# Patient Record
Sex: Male | Born: 1965
Health system: Southern US, Community
[De-identification: ages and names within clinical notes are randomized; demographics above are authoritative.]

## PROBLEM LIST (undated history)

## (undated) DIAGNOSIS — F909 Attention-deficit hyperactivity disorder, unspecified type: Secondary | ICD-10-CM

## (undated) HISTORY — PX: ORCHIOPEXY: SHX479

## (undated) HISTORY — DX: Attention-deficit hyperactivity disorder, unspecified type: F90.9

## (undated) HISTORY — PX: WISDOM TOOTH EXTRACTION: SHX21

---

## 2005-05-18 ENCOUNTER — Ambulatory Visit (HOSPITAL_COMMUNITY): Admission: RE | Admit: 2005-05-18 | Discharge: 2005-05-18 | Payer: Self-pay | Admitting: Infectious Diseases

## 2010-07-25 ENCOUNTER — Inpatient Hospital Stay (INDEPENDENT_AMBULATORY_CARE_PROVIDER_SITE_OTHER)
Admission: RE | Admit: 2010-07-25 | Discharge: 2010-07-25 | Disposition: A | Discharge status: Home / Self Care | Payer: Self-pay | Source: Ambulatory Visit | Attending: Emergency Medicine | Admitting: Emergency Medicine

## 2010-07-25 DIAGNOSIS — J019 Acute sinusitis, unspecified: Secondary | ICD-10-CM

## 2010-07-25 DIAGNOSIS — J069 Acute upper respiratory infection, unspecified: Secondary | ICD-10-CM

## 2011-11-08 ENCOUNTER — Emergency Department (HOSPITAL_COMMUNITY)
Admission: EM | Admit: 2011-11-08 | Discharge: 2011-11-08 | Disposition: A | Discharge status: Home / Self Care | Payer: 59 | Attending: Emergency Medicine | Admitting: Emergency Medicine

## 2011-11-08 ENCOUNTER — Emergency Department (HOSPITAL_COMMUNITY): Payer: 59

## 2011-11-08 DIAGNOSIS — N390 Urinary tract infection, site not specified: Secondary | ICD-10-CM | POA: Insufficient documentation

## 2011-11-08 DIAGNOSIS — K7689 Other specified diseases of liver: Secondary | ICD-10-CM | POA: Insufficient documentation

## 2011-11-08 DIAGNOSIS — R319 Hematuria, unspecified: Secondary | ICD-10-CM

## 2011-11-08 LAB — DIFFERENTIAL
Basophils Absolute: 0 10*3/uL (ref 0.0–0.1)
Eosinophils Absolute: 0 10*3/uL (ref 0.0–0.7)
Lymphocytes Relative: 7 % — ABNORMAL LOW (ref 12–46)
Lymphs Abs: 1.2 10*3/uL (ref 0.7–4.0)
Neutrophils Relative %: 86 % — ABNORMAL HIGH (ref 43–77)

## 2011-11-08 LAB — URINALYSIS, ROUTINE W REFLEX MICROSCOPIC
Glucose, UA: NEGATIVE mg/dL
pH: 6 (ref 5.0–8.0)

## 2011-11-08 LAB — CBC
MCH: 29.6 pg (ref 26.0–34.0)
Platelets: 165 10*3/uL (ref 150–400)
RBC: 4.93 MIL/uL (ref 4.22–5.81)
WBC: 16.6 10*3/uL — ABNORMAL HIGH (ref 4.0–10.5)

## 2011-11-08 LAB — POCT I-STAT, CHEM 8
Creatinine, Ser: 1.2 mg/dL (ref 0.50–1.35)
Hemoglobin: 15 g/dL (ref 13.0–17.0)
Potassium: 3.6 mEq/L (ref 3.5–5.1)
Sodium: 137 mEq/L (ref 135–145)

## 2011-11-08 LAB — URINE MICROSCOPIC-ADD ON

## 2011-11-08 MED ORDER — CIPROFLOXACIN HCL 500 MG PO TABS: 500.0000 mg | ORAL_TABLET | Freq: Two times a day (BID) | ORAL | Status: AC

## 2011-11-08 NOTE — ED Notes (Addendum)
Katha RN made aware pt is in room and has not been triaged.  Pt in no acute distress.  Sent to bathroom for urine sample

## 2011-11-08 NOTE — ED Notes (Signed)
The pt was escorted to the d/c desk. 

## 2011-11-08 NOTE — ED Provider Notes (Signed)
Medical screening examination/treatment/procedure(s) were performed by non-physician practitioner and as supervising physician I was immediately available for consultation/collaboration.   Forbes Cellar, MD 11/08/11 [phone]

## 2011-11-08 NOTE — ED Provider Notes (Signed)
History     CSN: [id number]  Arrival date & time 11/08/11  1001   First MD Initiated Contact with Patient 11/08/11 1017      10:46 AM HPI Patient reports last night began having dysuria, hematuria. Reports blood in urine slowly became clots. Reports at approximately 6 AM this resolved he continues to have increased urinary urgency, and dysuria. States he also noticed clear penile discharge. Reports mild abdominal pain, back pain, and flank pain. Patient is a 46 y.o. male presenting with hematuria. The history is provided by the patient.  Hematuria This is a new problem. The current episode started today. The problem has been gradually improving since onset. He describes the hematuria as gross hematuria. The pain is mild. Irritative symptoms include frequency and urgency. Associated symptoms include abdominal pain. Pertinent negatives include no chills, fever, flank pain, nausea or vomiting. He is sexually active. There is no history of GU trauma, kidney stones, prostatitis or STDs.    No past medical history on file.  No past surgical history on file.  No family history on file.  History  Substance Use Topics  . Smoking status: Not on file  . Smokeless tobacco: Not on file  . Alcohol Use: Not on file      Review of Systems  Constitutional: Negative for fever and chills.  Respiratory: Negative for shortness of breath.   Cardiovascular: Negative for chest pain.  Gastrointestinal: Positive for abdominal pain. Negative for nausea, vomiting, diarrhea, constipation, blood in stool and rectal pain.  Genitourinary: Positive for urgency, frequency and hematuria. Negative for flank pain, discharge, penile swelling, scrotal swelling, penile pain and testicular pain.  Musculoskeletal: Positive for back pain.  Neurological: Negative for dizziness, weakness, numbness and headaches.  All other systems reviewed and are negative.    Allergies  Review of patient's allergies indicates no  known allergies.  Home Medications   Current Outpatient Rx  Name Route Sig Dispense Refill  . LISDEXAMFETAMINE DIMESYLATE 40 MG PO CAPS Oral Take 40 mg by mouth daily.      BP 113/61  Pulse 95  Temp(Src) 99.1 F (37.3 C) (Oral)  Resp 18  SpO2 99%  Physical Exam  Constitutional: He is oriented to person, place, and time. He appears well-developed and well-nourished.  HENT:  Head: Normocephalic and atraumatic.  Eyes: Conjunctivae are normal. Pupils are equal, round, and reactive to light.  Neck: Normal range of motion. Neck supple.  Cardiovascular: Normal rate, regular rhythm and normal heart sounds.   Pulmonary/Chest: Effort normal and breath sounds normal.  Abdominal: Soft. Bowel sounds are normal. He exhibits no distension and no mass. There is no hepatosplenomegaly. There is no tenderness. There is no rigidity, no rebound, no guarding and no CVA tenderness.  Genitourinary: No penile tenderness. Discharge (clear) found.  Neurological: He is alert and oriented to person, place, and time.  Skin: Skin is warm and dry. No rash noted. No erythema. No pallor.  Psychiatric: He has a normal mood and affect. His behavior is normal.    ED Course  Procedures  Results for orders placed during the hospital encounter of 11/08/11  URINALYSIS, ROUTINE W REFLEX MICROSCOPIC      Component Value Range   Color, Urine AMBER (*) YELLOW    APPearance CLOUDY (*) CLEAR    Specific Gravity, Urine 1.025  1.005 - 1.030    pH 6.0  5.0 - 8.0    Glucose, UA NEGATIVE  NEGATIVE (mg/dL)   Hgb urine dipstick LARGE (*)  NEGATIVE    Bilirubin Urine NEGATIVE  NEGATIVE    Ketones, ur TRACE (*) NEGATIVE (mg/dL)   Protein, ur 540 (*) NEGATIVE (mg/dL)   Urobilinogen, UA 0.2  0.0 - 1.0 (mg/dL)   Nitrite NEGATIVE  NEGATIVE    Leukocytes, UA LARGE (*) NEGATIVE   CBC      Component Value Range   WBC 16.6 (*) 4.0 - 10.5 (K/uL)   RBC 4.93  4.22 - 5.81 (MIL/uL)   Hemoglobin 14.6  13.0 - 17.0 (g/dL)   HCT 98.1   19.1 - 47.8 (%)   MCV 84.0  78.0 - 100.0 (fL)   MCH 29.6  26.0 - 34.0 (pg)   MCHC 35.3  30.0 - 36.0 (g/dL)   RDW 29.5  62.1 - 30.8 (%)   Platelets 165  150 - 400 (K/uL)  DIFFERENTIAL      Component Value Range   Neutrophils Relative 86 (*) 43 - 77 (%)   Neutro Abs 14.3 (*) 1.7 - 7.7 (K/uL)   Lymphocytes Relative 7 (*) 12 - 46 (%)   Lymphs Abs 1.2  0.7 - 4.0 (K/uL)   Monocytes Relative 7  3 - 12 (%)   Monocytes Absolute 1.1 (*) 0.1 - 1.0 (K/uL)   Eosinophils Relative 0  0 - 5 (%)   Eosinophils Absolute 0.0  0.0 - 0.7 (K/uL)   Basophils Relative 0  0 - 1 (%)   Basophils Absolute 0.0  0.0 - 0.1 (K/uL)  POCT I-STAT, CHEM 8      Component Value Range   Sodium 137  135 - 145 (mEq/L)   Potassium 3.6  3.5 - 5.1 (mEq/L)   Chloride 103  96 - 112 (mEq/L)   BUN 12  6 - 23 (mg/dL)   Creatinine, Ser 6.57  0.50 - 1.35 (mg/dL)   Glucose, Bld 846 (*) 70 - 99 (mg/dL)   Calcium, Ion 9.62  1.12 - 1.32 (mmol/L)   TCO2 20  0 - 100 (mmol/L)   Hemoglobin 15.0  13.0 - 17.0 (g/dL)   HCT 95.2  84.1 - 32.4 (%)  URINE MICROSCOPIC-ADD ON      Component Value Range   WBC, UA 21-50  <3 (WBC/hpf)   RBC / HPF 11-20  <3 (RBC/hpf)   Bacteria, UA MANY (*) RARE    Urine-Other MUCOUS PRESENT     Ct Abdomen Pelvis Wo Contrast  11/08/2011  *RADIOLOGY REPORT*  Clinical Data:  Testicular and back pain.  Rule out kidney stones.  CT ABDOMEN AND PELVIS WITHOUT CONTRAST (CT UROGRAM)  Technique: Contiguous axial images of the abdomen and pelvis without oral or intravenous contrast were obtained.  Comparison: None  Findings:  Exam is limited for evaluation of entities other than urinary tract calculi due to lack of oral or intravenous contrast.   Minimal atelectasis at the lung bases. Normal heart size without pericardial or pleural effusion.  Mild hepatic steatosis.  Normal uninfused appearance of the spleen, stomach, pancreas, gallbladder, biliary tract, adrenal glands. No renal calculi or hydronephrosis. A left extrarenal  pelvis.  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 No hydroureter or ureteric calculi.  Accessory lower pole left renal artery. No retroperitoneal or retrocrural adenopathy.  Normal colon, appendix, and terminal ileum.  Normal small bowel without abdominal ascites.    No pelvic adenopathy.    Normal urinary bladder and prostate.  No significant free fluid.  Lucent lesion within the posterior left ilium on image 59 has a nonaggressive  appearance and may represent a lipoma or hemangioma.  Degenerative disc disease at the lumbosacral junction.  IMPRESSION:  1.  No urinary tract calculi or hydronephrosis. 2.  Atypically positioned right kidney, as detailed above. 3.  Mild hepatic steatosis.  Original Report Authenticated By: Consuello Bossier, M.D.     MDM    12:24 PM Patient has not wanted anything for pain. Notes indicate the patient has a urinary tract infection versus hemorrhagic cystitis. Advise close followup with his primary care physician for urine recheck in one week. Patient states he follows up with [facility]. Advised patient's GC and Chlamydia tests are still pending if they're positive he will receive a call be placed on the appropriate antibiotics. Patient voices understanding and is ready for discharge. Again does not request anything for pain to go home with. We'll prescribe Cipro x7 days.     Thomasene Lot, PA-C 11/08/11 1226

## 2011-11-08 NOTE — ED Notes (Signed)
Pt reports acute onset of urethral  pain, with frequency, hesitancy, and hematuria that finally cleared up around 4am followed by penial d/c.Marland Kitchen    With hematuria, and white d/c

## 2011-11-08 NOTE — ED Notes (Signed)
Patient transported to CT, has now returned...states pain is still 2-3/10 warm blanket given.

## 2011-11-08 NOTE — Discharge Instructions (Signed)

## 2011-11-09 LAB — GC/CHLAMYDIA PROBE AMP, GENITAL: GC Probe Amp, Genital: NEGATIVE

## 2014-12-16 ENCOUNTER — Ambulatory Visit (INDEPENDENT_AMBULATORY_CARE_PROVIDER_SITE_OTHER): Payer: 59

## 2014-12-16 ENCOUNTER — Ambulatory Visit (INDEPENDENT_AMBULATORY_CARE_PROVIDER_SITE_OTHER): Payer: 59 | Admitting: Family Medicine

## 2014-12-16 VITALS — BP 118/80 | HR 92 | Temp 98.4°F | Resp 16 | Ht 71.0 in | Wt 193.4 lb

## 2014-12-16 DIAGNOSIS — M25511 Pain in right shoulder: Secondary | ICD-10-CM

## 2014-12-16 MED ORDER — DICLOFENAC SODIUM 75 MG PO TBEC: 75.0000 mg | DELAYED_RELEASE_TABLET | Freq: Two times a day (BID) | ORAL | Status: DC

## 2014-12-16 NOTE — Patient Instructions (Signed)
Take diclofenac one twice daily  Apply ice 2-4 times daily  Gentle stretches  Bursitis Bursitis is a swelling and soreness (inflammation) of a fluid-filled sac (bursa) that overlies and protects a joint. It can be caused by injury, overuse of the joint, arthritis or infection. The joints most likely to be affected are the elbows, shoulders, hips and knees. HOME CARE INSTRUCTIONS   Apply ice to the affected area for 15-20 minutes each hour while awake for 2 days. Put the ice in a plastic bag and place a towel between the bag of ice and your skin.  Rest the injured joint as much as possible, but continue to put the joint through a full range of motion, 4 times per day. (The shoulder joint especially becomes rapidly "frozen" if not used.) When the pain lessens, begin normal slow movements and usual activities.  Only take over-the-counter or prescription medicines for pain, discomfort or fever as directed by your caregiver.  Your caregiver may recommend draining the bursa and injecting medicine into the bursa. This may help the healing process.  Follow all instructions for follow-up with your caregiver. This includes any orthopedic referrals, physical therapy and rehabilitation. Any delay in obtaining necessary care could result in a delay or failure of the bursitis to heal and chronic pain. SEEK IMMEDIATE MEDICAL CARE IF:   Your pain increases even during treatment.  You develop an oral temperature above 102 F (38.9 C) and have heat and inflammation over the involved bursa. MAKE SURE YOU:   Understand these instructions.  Will watch your condition.  Will get help right away if you are not doing well or get worse. Document Released: 05/18/2000 Document Revised: 08/13/2011 Document Reviewed: 08/10/2013 [facility] Patient Information 2015 [place], [place]. This information is not intended to replace advice given to you by your health care provider. Make sure you discuss any questions you  have with your health care provider.
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
## 2015-06-17 DIAGNOSIS — J342 Deviated nasal septum: Secondary | ICD-10-CM | POA: Diagnosis not present

## 2015-06-17 DIAGNOSIS — J343 Hypertrophy of nasal turbinates: Secondary | ICD-10-CM | POA: Diagnosis not present

## 2015-07-12 ENCOUNTER — Encounter (HOSPITAL_BASED_OUTPATIENT_CLINIC_OR_DEPARTMENT_OTHER): Payer: Self-pay | Admitting: *Deleted

## 2015-07-12 MED FILL — METHYLPHENIDATE ER 36 MG TA: 36 | 30 days supply | Qty: 30 | Fill #0

## 2015-07-13 ENCOUNTER — Ambulatory Visit: Payer: Self-pay | Admitting: Otolaryngology

## 2015-07-13 MED FILL — CEFUROXIME AXETIL 250 MG TA: 250 | 10 days supply | Qty: 20 | Fill #0

## 2015-07-13 NOTE — H&P (Signed)
NAMESIDDH, CRENSHAW NO.:  [id number]  MEDICAL RECORD NO.:  [id number]  LOCATION:                                 FACILITY:  PHYSICIAN:  Minna Merritts, M.D.   DATE OF BIRTH:  Aug 26, 1965  DATE OF ADMISSION: DATE OF DISCHARGE:                             HISTORY & PHYSICAL   HISTORY OF PRESENT ILLNESS:  The patient is a 50 year old male who comes in with multiple sinus infections and he has a severe deviated nasal septum.  Been seen in my office and has a history of nasal trauma playing basketball and football and has difficulty breathing through his nose, history of sinusitis.  He wishes a septal reconstruction, and on examination we could easily see the severe septal deviation.  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  He has also had ADHD history.  ALLERGIES:  He is allergic to no medications.  MEDICATIONS:  Concerta 54 mg per day is his only medication.  PAST SURGICAL HISTORY:  He apparently denied any past surgeries.  FAMILY HISTORY:  Colon cancer in grandfather.  Mom had ALS, blood pressure elevation, and diabetes.  SOCIAL HISTORY:  He hikes, exercises, drinks a couple of beers a week, does not smoke.  REVIEW OF SYSTEMS:  Also unremarkable in the fact that he has had the nasal blockage and the rhinitis and pressure in his ears, but he has no cardiovascular, no wheezing or shortness of breath.  He has had a cough in the past associated with sinus infection.  Also, history of reflux. Genitourinary, musculoskeletal, neurologic, endocrine, hematologic, allergy all negative.  He has ADHD and is otherwise in excellent health.  PHYSICAL EXAMINATION:  HEENT:  Ears to be completely clear.  The tympanic membranes looked excellent and moved well.  His nose however does show a severe septal deviation going to the left, blocks his nose, and it is also quite narrow but contributing  factor to his sinusitis. He has about a 2 year history of sinusitis.  His nasal blockage I would say would be roughly 80% blockage on the left and more airway on the right.  So his airway would be 20% on the left and 80% on the right, it is just the nose is blocked up.  He tries to eat.  Sometimes feel blocked up as well when he tries to eat and definitely when he tries to exercise.  Turbinate hypertrophy as well which he has compensatory hypertrophy as well.  His larynx is clear.  True cords, false cords, epiglottis, base of tongue, lateral pharyngeal walls are clear of ulceration, mass, or lesion, and true cord mobility, gag reflex, tongue mobility, EOM, facial nerve are all symmetrical as is his shoulder strength.  His EOMs are completely clear.  He has no evidence of any depression, and he is oriented x3.  The oral cavity is completely clear. Lips, teeth, and gums are unremarkable. Buccal mucous membranes are also unremarkable.  He shows no reflux and no post cricoid erythema on his laryngeal examination. NECK:  Free of any thyromegaly, cervical adenopathy or mass.  No salivary gland  abnormalities. GENERAL:  Excellent.  Posterior triangles and scalp and skin are clear. RESPIRATORY ISSUES:  None. CARDIOVASCULAR:  None. ABDOMINAL ISSUES:  None except the reflux. EXTREMITIES:  Also unremarkable.  INITIAL DIAGNOSIS:  Septal deviation with turbinate hypertrophy, with history of sinusitis, with severe nasal obstruction issues.          ______________________________ Minna Merritts, M.D.     JC/MEDQ  D:  07/13/2015  T:  07/13/2015  Job:  [id number]

## 2015-07-14 ENCOUNTER — Encounter (HOSPITAL_BASED_OUTPATIENT_CLINIC_OR_DEPARTMENT_OTHER)
Admission: RE | Admit: 2015-07-14 | Discharge: 2015-07-14 | Disposition: A | Discharge status: Home / Self Care | Payer: 59 | Source: Ambulatory Visit | Attending: Otolaryngology | Admitting: Otolaryngology

## 2015-07-14 DIAGNOSIS — J343 Hypertrophy of nasal turbinates: Secondary | ICD-10-CM | POA: Diagnosis present

## 2015-07-14 LAB — CBC
HCT: 42.7 % (ref 39.0–52.0)
Hemoglobin: 14.8 g/dL (ref 13.0–17.0)
MCH: 29.7 pg (ref 26.0–34.0)
MCHC: 34.7 g/dL (ref 30.0–36.0)
MCV: 85.7 fL (ref 78.0–100.0)
Platelets: 152 K/uL (ref 150–400)
RBC: 4.98 MIL/uL (ref 4.22–5.81)
RDW: 12.2 % (ref 11.5–15.5)
WBC: 8.7 K/uL (ref 4.0–10.5)

## 2015-07-15 ENCOUNTER — Encounter (HOSPITAL_BASED_OUTPATIENT_CLINIC_OR_DEPARTMENT_OTHER)
Admission: RE | Disposition: A | Discharge status: Home / Self Care | Payer: Self-pay | Source: Ambulatory Visit | Attending: Otolaryngology

## 2015-07-15 ENCOUNTER — Ambulatory Visit (HOSPITAL_BASED_OUTPATIENT_CLINIC_OR_DEPARTMENT_OTHER): Payer: 59 | Admitting: Anesthesiology

## 2015-07-15 ENCOUNTER — Ambulatory Visit (HOSPITAL_BASED_OUTPATIENT_CLINIC_OR_DEPARTMENT_OTHER)
Admission: RE | Admit: 2015-07-15 | Discharge: 2015-07-15 | Disposition: A | Discharge status: Home / Self Care | Payer: 59 | Source: Ambulatory Visit | Attending: Otolaryngology | Admitting: Otolaryngology

## 2015-07-15 ENCOUNTER — Encounter (HOSPITAL_BASED_OUTPATIENT_CLINIC_OR_DEPARTMENT_OTHER): Payer: Self-pay | Admitting: *Deleted

## 2015-07-15 DIAGNOSIS — J342 Deviated nasal septum: Secondary | ICD-10-CM | POA: Diagnosis not present

## 2015-07-15 DIAGNOSIS — J343 Hypertrophy of nasal turbinates: Secondary | ICD-10-CM | POA: Insufficient documentation

## 2015-07-15 HISTORY — PX: TURBINATE REDUCTION: SHX6157

## 2015-07-15 HISTORY — PX: NASAL RECONSTRUCTION WITH SEPTAL REPAIR: SHX5665

## 2015-07-15 SURGERY — RECONSTRUCTION, NOSE, WITH NASAL SEPTUM REPAIR
Anesthesia: General | Site: Nose

## 2015-07-15 MED ORDER — HYDROMORPHONE HCL 1 MG/ML IJ SOLN: 0.2500 mg | INTRAMUSCULAR | Status: DC | PRN

## 2015-07-15 MED ORDER — BACITRACIN-NEOMYCIN-POLYMYXIN 400-5-5000 EX OINT
TOPICAL_OINTMENT | CUTANEOUS | Status: AC
  Filled 2015-07-15: qty 1

## 2015-07-15 MED ORDER — GLYCOPYRROLATE 0.2 MG/ML IJ SOLN: 0.2000 mg | Freq: Once | INTRAMUSCULAR | Status: DC | PRN

## 2015-07-15 MED ORDER — LIDOCAINE HCL (CARDIAC) 20 MG/ML IV SOLN
INTRAVENOUS | Status: AC
  Filled 2015-07-15: qty 5

## 2015-07-15 MED ORDER — ONDANSETRON HCL 4 MG/2ML IJ SOLN
INTRAMUSCULAR | Status: DC | PRN
  Administered 2015-07-15: 4 mg via INTRAVENOUS

## 2015-07-15 MED ORDER — LIDOCAINE-EPINEPHRINE 1 %-1:100000 IJ SOLN
INTRAMUSCULAR | Status: DC | PRN
  Administered 2015-07-15: 3 mL

## 2015-07-15 MED ORDER — HYDROCODONE-IBUPROFEN 5-200 MG PO TABS: 1.0000 | ORAL_TABLET | Freq: Three times a day (TID) | ORAL | Status: DC | PRN

## 2015-07-15 MED ORDER — EPHEDRINE SULFATE 50 MG/ML IJ SOLN
INTRAMUSCULAR | Status: AC
  Filled 2015-07-15: qty 1

## 2015-07-15 MED ORDER — LACTATED RINGERS IV SOLN
INTRAVENOUS | Status: DC
  Administered 2015-07-15 (×2): via INTRAVENOUS

## 2015-07-15 MED ORDER — PROMETHAZINE HCL 25 MG/ML IJ SOLN: 6.2500 mg | INTRAMUSCULAR | Status: DC | PRN

## 2015-07-15 MED ORDER — LIDOCAINE HCL (CARDIAC) 20 MG/ML IV SOLN
INTRAVENOUS | Status: DC | PRN
  Administered 2015-07-15 (×2): 50 mg via INTRAVENOUS

## 2015-07-15 MED ORDER — OXYCODONE HCL 5 MG/5ML PO SOLN: 5.0000 mg | Freq: Once | ORAL | Status: DC | PRN

## 2015-07-15 MED ORDER — DEXAMETHASONE SODIUM PHOSPHATE 4 MG/ML IJ SOLN
INTRAMUSCULAR | Status: DC | PRN
  Administered 2015-07-15: 10 mg via INTRAVENOUS

## 2015-07-15 MED ORDER — PROPOFOL 10 MG/ML IV BOLUS
INTRAVENOUS | Status: DC | PRN
  Administered 2015-07-15: 200 mg via INTRAVENOUS

## 2015-07-15 MED ORDER — MEPERIDINE HCL 25 MG/ML IJ SOLN: 6.2500 mg | INTRAMUSCULAR | Status: DC | PRN

## 2015-07-15 MED ORDER — DEXAMETHASONE SODIUM PHOSPHATE 10 MG/ML IJ SOLN
INTRAMUSCULAR | Status: AC
  Filled 2015-07-15: qty 1

## 2015-07-15 MED ORDER — ATROPINE SULFATE 0.4 MG/ML IJ SOLN
INTRAMUSCULAR | Status: AC
  Filled 2015-07-15: qty 1

## 2015-07-15 MED ORDER — SODIUM CHLORIDE 0.9 % IJ SOLN
INTRAMUSCULAR | Status: AC
  Filled 2015-07-15: qty 10

## 2015-07-15 MED ORDER — SUCCINYLCHOLINE CHLORIDE 20 MG/ML IJ SOLN
INTRAMUSCULAR | Status: AC
  Filled 2015-07-15: qty 1

## 2015-07-15 MED ORDER — SUFENTANIL CITRATE 50 MCG/ML IV SOLN
INTRAVENOUS | Status: AC
  Filled 2015-07-15: qty 1

## 2015-07-15 MED ORDER — FENTANYL CITRATE (PF) 100 MCG/2ML IJ SOLN: 50.0000 ug | INTRAMUSCULAR | Status: DC | PRN

## 2015-07-15 MED ORDER — BACITRACIN-NEOMYCIN-POLYMYXIN 400-5-5000 EX OINT
TOPICAL_OINTMENT | CUTANEOUS | Status: DC | PRN
  Administered 2015-07-15: 1 via TOPICAL

## 2015-07-15 MED ORDER — SODIUM BICARBONATE 4 % IV SOLN
INTRAVENOUS | Status: AC
  Filled 2015-07-15: qty 5

## 2015-07-15 MED ORDER — PHENYLEPHRINE 40 MCG/ML (10ML) SYRINGE FOR IV PUSH (FOR BLOOD PRESSURE SUPPORT)
PREFILLED_SYRINGE | INTRAVENOUS | Status: AC
  Filled 2015-07-15: qty 10

## 2015-07-15 MED ORDER — PROPOFOL 500 MG/50ML IV EMUL
INTRAVENOUS | Status: AC
Start: 2015-07-15 — End: 2015-07-15
  Filled 2015-07-15: qty 50

## 2015-07-15 MED ORDER — SUCCINYLCHOLINE CHLORIDE 20 MG/ML IJ SOLN
INTRAMUSCULAR | Status: DC | PRN
  Administered 2015-07-15: 180 mg via INTRAVENOUS

## 2015-07-15 MED ORDER — ONDANSETRON HCL 4 MG/2ML IJ SOLN
INTRAMUSCULAR | Status: AC
  Filled 2015-07-15: qty 2

## 2015-07-15 MED ORDER — SCOPOLAMINE 1 MG/3DAYS TD PT72: 1.0000 | MEDICATED_PATCH | Freq: Once | TRANSDERMAL | Status: DC | PRN

## 2015-07-15 MED ORDER — MIDAZOLAM HCL 2 MG/2ML IJ SOLN
INTRAMUSCULAR | Status: AC
  Filled 2015-07-15: qty 2

## 2015-07-15 MED ORDER — COCAINE HCL 4 % EX SOLN
CUTANEOUS | Status: AC
  Filled 2015-07-15: qty 4

## 2015-07-15 MED ORDER — OXYCODONE HCL 5 MG PO TABS: 5.0000 mg | ORAL_TABLET | Freq: Once | ORAL | Status: DC | PRN

## 2015-07-15 MED ORDER — MIDAZOLAM HCL 2 MG/2ML IJ SOLN
1.0000 mg | INTRAMUSCULAR | Status: DC | PRN
  Administered 2015-07-15: 2 mg via INTRAVENOUS

## 2015-07-15 MED ORDER — SUFENTANIL CITRATE 50 MCG/ML IV SOLN
INTRAVENOUS | Status: DC | PRN
  Administered 2015-07-15 (×2): 10 ug via INTRAVENOUS

## 2015-07-15 MED FILL — HYDROCOD-IBU 7.5-200 TAB: 7.5-200 | 10 days supply | Qty: 30 | Fill #0

## 2015-07-15 SURGICAL SUPPLY — 30 items
ATTRACTOMAT 16X20 MAGNETIC DRP (DRAPES) ×2 IMPLANT
CANISTER SUCT 1200ML W/VALVE (MISCELLANEOUS) ×4 IMPLANT
COAGULATOR SUCT 8FR VV (MISCELLANEOUS) ×2 IMPLANT
DRSG NASAL KENNEDY LMNT 8CM (GAUZE/BANDAGES/DRESSINGS) IMPLANT
GLOVE BIOGEL PI IND STRL 7.0 (GLOVE) IMPLANT
GLOVE BIOGEL PI INDICATOR 7.0 (GLOVE) ×2
GLOVE ECLIPSE 6.5 STRL STRAW (GLOVE) ×2 IMPLANT
GLOVE SURG SS PI 7.5 STRL IVOR (GLOVE) ×4 IMPLANT
GOWN STRL REUS W/ TWL LRG LVL3 (GOWN DISPOSABLE) ×2 IMPLANT
GOWN STRL REUS W/ TWL XL LVL3 (GOWN DISPOSABLE) ×2 IMPLANT
GOWN STRL REUS W/TWL LRG LVL3 (GOWN DISPOSABLE) ×4
GOWN STRL REUS W/TWL XL LVL3 (GOWN DISPOSABLE) ×4
NDL SPNL 25GX3.5 QUINCKE BL (NEEDLE) ×2 IMPLANT
NEEDLE SPNL 25GX3.5 QUINCKE BL (NEEDLE) ×4 IMPLANT
NS IRRIG 1000ML POUR BTL (IV SOLUTION) ×2 IMPLANT
PACK BASIN DAY SURGERY FS (CUSTOM PROCEDURE TRAY) ×4 IMPLANT
PACK ENT DAY SURGERY (CUSTOM PROCEDURE TRAY) ×4 IMPLANT
SPLINT NASAL THERMO PLAST (MISCELLANEOUS) IMPLANT
SPONGE GAUZE 2X2 8PLY STER LF (GAUZE/BANDAGES/DRESSINGS) ×1
SPONGE GAUZE 2X2 8PLY STRL LF (GAUZE/BANDAGES/DRESSINGS) ×3 IMPLANT
SPONGE SURGIFOAM ABS GEL 12-7 (HEMOSTASIS) IMPLANT
STRIP SUTURE WOUND CLOSURE 1/2 (SUTURE) IMPLANT
SUT BONE WAX W31G (SUTURE) ×2 IMPLANT
SUT PDS AB 4-0 P3 18 (SUTURE) IMPLANT
SUT PLAIN 4 0 ~~LOC~~ 1 (SUTURE) ×4 IMPLANT
SUT PLAIN 5 0 P 3 18 (SUTURE) ×4 IMPLANT
SYR 3ML 23GX1 SAFETY (SYRINGE) ×2 IMPLANT
TOWEL OR 17X24 6PK STRL BLUE (TOWEL DISPOSABLE) ×8 IMPLANT
TRAY DSU PREP LF (CUSTOM PROCEDURE TRAY) ×4 IMPLANT
YANKAUER SUCT BULB TIP NO VENT (SUCTIONS) ×4 IMPLANT

## 2015-07-15 NOTE — Brief Op Note (Signed)
07/15/2015  1:52 PM  PATIENT:  Nicholas Osborne  50 y.o. male  PRE-OPERATIVE DIAGNOSIS:  SEPTAL DEVIATION, TURBINATE HYPERTROPHY  POST-OPERATIVE DIAGNOSIS:  SEPTAL DEVIATION, TURBINATE HYPERTROPHY  PROCEDURE:  Procedure(s): SEPTAL REPAIR RECONSTRUCTION  (N/A) BILATERAL TURBINATE REDUCTION (Bilateral)  SURGEON:  Surgeon(s) and Role:    * Thornell Sartorius, MD - Primary  PHYSICIAN ASSISTANT:   ASSISTANTS: none   ANESTHESIA:   general  EBL:  Total I/O In: 1200 [I.V.:1200] Out: 200 [Blood:200]  BLOOD ADMINISTERED:none  DRAINS: none   LOCAL MEDICATIONS USED:  XYLOCAINE   SPECIMEN:  Source of Specimen:               septal cartilage  DISPOSITION OF SPECIMEN:  PATHOLOGY  COUNTS:  YES  TOURNIQUET:  * No tourniquets in log *  DICTATION: .Other Dictation: Dictation Number [id number]  PLAN OF CARE: Discharge to home after PACU  PATIENT DISPOSITION:  PACU - hemodynamically stable.   Delay start of Pharmacological VTE agent (>24hrs) due to surgical blood loss or risk of bleeding: not applicable

## 2015-07-15 NOTE — Interval H&P Note (Signed)
History and Physical Interval Note:  07/15/2015 12:10 PM  Nicholas Osborne  has presented today for surgery, with the diagnosis of SEPTAL DEVIATION, TURBINATE HYPERTROPHY  The various methods of treatment have been discussed with the patient and family. After consideration of risks, benefits and other options for treatment, the patient has consented to  Procedure(s): SEPTAL REPAIR RECONSTRUCTION  (N/A) BILATERAL TURBINATE REDUCTION (Bilateral) as a surgical intervention .  The patient's history has been reviewed, patient examined, no change in status, stable for surgery.  I have reviewed the patient's chart and labs.  Questions were answered to the patient's satisfaction.     Minna Merritts

## 2015-07-15 NOTE — Discharge Instructions (Addendum)
Remove packing in 1-2 days. Cool/iced compresses to nose for 4 hours today. Elevate head 30 degrees. Soft limited diet tonight. Call for bleeding concerns- if exceeds 3-4 drip pads per hour without activity. No nose blowing or strenuous activity.   Post Anesthesia Home Care Instructions  Activity: Get plenty of rest for the remainder of the day. A responsible adult should stay with you for 24 hours following the procedure.  For the next 24 hours, DO NOT: -Drive a car -Paediatric nurse -Drink alcoholic beverages -Take any medication unless instructed by your physician -Make any legal decisions or sign important papers.  Meals: Start with liquid foods such as gelatin or soup. Progress to regular foods as tolerated. Avoid greasy, spicy, heavy foods. If nausea and/or vomiting occur, drink only clear liquids until the nausea and/or vomiting subsides. Call your physician if vomiting continues.  Special Instructions/Symptoms: Your throat may feel dry or sore from the anesthesia or the breathing tube placed in your throat during surgery. If this causes discomfort, gargle with warm salt water. The discomfort should disappear within 24 hours.  If you had a scopolamine patch placed behind your ear for the management of post- operative nausea and/or vomiting:  1. The medication in the patch is effective for 72 hours, after which it should be removed.  Wrap patch in a tissue and discard in the trash. Wash hands thoroughly with soap and water. 2. You may remove the patch earlier than 72 hours if you experience unpleasant side effects which may include dry mouth, dizziness or visual disturbances. 3. Avoid touching the patch. Wash your hands with soap and water after contact with the patch.

## 2015-07-15 NOTE — Anesthesia Preprocedure Evaluation (Addendum)

## 2015-07-15 NOTE — Transfer of Care (Signed)
Immediate Anesthesia Transfer of Care Note  Patient: Nicholas Osborne  Procedure(s) Performed: Procedure(s): SEPTAL REPAIR RECONSTRUCTION  (N/A) BILATERAL TURBINATE REDUCTION (Bilateral)  Patient Location: PACU  Anesthesia Type:General  Level of Consciousness: awake, alert  and oriented  Airway & Oxygen Therapy: Patient Spontanous Breathing and Patient connected to face mask oxygen  Post-op Assessment: Report given to RN and Post -op Vital signs reviewed and stable  Post vital signs: Reviewed and stable  Last Vitals:  Filed Vitals:   07/15/15 1126  BP: 111/66  Pulse: 81  Temp: 36.8 C  Resp: 16    Complications: No apparent anesthesia complications

## 2015-07-15 NOTE — Anesthesia Postprocedure Evaluation (Signed)
Anesthesia Post Note  Patient: Nicholas Osborne  Procedure(s) Performed: Procedure(s) (LRB): SEPTAL REPAIR RECONSTRUCTION  (N/A) BILATERAL TURBINATE REDUCTION (Bilateral)  Patient location during evaluation: PACU Anesthesia Type: General Level of consciousness: awake and alert Pain management: pain level controlled Vital Signs Assessment: post-procedure vital signs reviewed and stable Respiratory status: spontaneous breathing, nonlabored ventilation and respiratory function stable Cardiovascular status: blood pressure returned to baseline and stable Postop Assessment: no signs of nausea or vomiting Anesthetic complications: no    Last Vitals:  Filed Vitals:   07/15/15 1415 07/15/15 1445  BP: 128/87 126/84  Pulse: 87 88  Temp:  36.9 C  Resp: 10 14    Last Pain:  Filed Vitals:   07/15/15 1448  PainSc: 0-No pain                 Marnie Fazzino A

## 2015-07-15 NOTE — Anesthesia Procedure Notes (Signed)
Procedure Name: Intubation Date/Time: 07/15/2015 12:25 PM Performed by: Melynda Ripple D Pre-anesthesia Checklist: Patient identified, Emergency Drugs available, Suction available and Patient being monitored Patient Re-evaluated:Patient Re-evaluated prior to inductionOxygen Delivery Method: Circle System Utilized Preoxygenation: Pre-oxygenation with 100% oxygen Intubation Type: IV induction Ventilation: Mask ventilation without difficulty Laryngoscope Size: Mac and 3 Grade View: Grade I Tube type: Oral Number of attempts: 1 Airway Equipment and Method: Stylet and Oral airway Placement Confirmation: ETT inserted through vocal cords under direct vision,  positive ETCO2 and breath sounds checked- equal and bilateral Secured at: 23 cm Tube secured with: Tape Dental Injury: Teeth and Oropharynx as per pre-operative assessment

## 2015-07-18 ENCOUNTER — Encounter (HOSPITAL_BASED_OUTPATIENT_CLINIC_OR_DEPARTMENT_OTHER): Payer: Self-pay | Admitting: Otolaryngology

## 2015-07-18 NOTE — Addendum Note (Signed)
Addendum  created 07/18/15 1104 by Marrianne Mood, CRNA   Modules edited: Charges VN

## 2015-07-18 NOTE — Op Note (Signed)
NAMEMARCELLO, [name] NO.:  [id number]  MEDICAL RECORD NO.:  [id number]  LOCATION: [facility]     FACILITY: [facility]  PHYSICIAN:  Minna Merritts, M.D.   DATE OF BIRTH:  1965/09/24  DATE OF PROCEDURE: 07/15/2015 DATE OF DISCHARGE: 07/15/2015                              OPERATIVE REPORT   PREOPERATIVE DIAGNOSIS:  Septal deviation turbinate hypertrophy.  POSTOPERATIVE DIAGNOSIS:  Septal deviation turbinate hypertrophy.  OPERATION:  Septal reconstruction, turbinate reduction.  ANESTHESIA:  General endotracheal.  SURGEON:  Minna Merritts, MD  The patient is aware of the risks and gains.  He is aware that he can have some bleeding postoperatively, he will have packing in his nose for probably 2 days and used to be very careful and quiet, not doing anything heavy work or any exercise.  He will also be on antibiotics and I will give him pain medication as well.  PROCEDURE IN DETAIL:  Patient placed in supine position under general endotracheal anesthesia.  The patient was prepped and draped in a proper manner using Hibiclens and usual head drape and 1% Xylocaine with epinephrine was used to inject into the turbinates, then into the septum, and then a hemitransfixion incision was made on the anterior left septal mucous membrane.  Membrane was then elevated using the sharp scissors and the [facility], and a portion of quadrilateral cartilage posteriorly was removed after the [place] had elevated the membrane.  Using the open and close Jansen-Middletons, there was also a severe septal deviation that was pushed way up against his left side of his nose and this was removed using the open and closed Ranelle Oyster- Middletons as well.  The septum was grossly off the midline and off the premaxillary crest, so the 4 mm chisel was used to take down this premaxillary crest.  A bone wax was used to limit any hemorrhage from that crest  bony structure.  Once this was corrected, the septum was brought back to the midline.  The middle turbinate and inferior turbinates were outfractured using the butter knife and the cottle elevator 12 was used to cauterize the anterior turbinates.  The suction Bovie electrocoagulation was used to minimize any bleeding as we bovied again the floor of the pre-septal area that did have some hemorrhagic issues.  The septum was so grossly deviated.  There was a lot of scar tissue and bony structure there that needed to be corrected.  Once this was achieved, the bleeding was discontinued.  The blood loss was approximately 150-200 mL and the closure was with 5-0 plain catgut and through and through septal suture blanket stitch with 4-0 plain catgut.  Intranasal dressing was placed.  Patient tolerated procedure well, was taken to the recovery room.   ______________________________ Minna Merritts, M.D.     JC/MEDQ  D:  07/15/2015  T:  07/16/2015  Job:  [id number]

## 2015-08-02 DIAGNOSIS — Z0000 Encounter for general adult medical examination without abnormal findings: Secondary | ICD-10-CM | POA: Diagnosis not present

## 2015-08-02 DIAGNOSIS — Z298 Encounter for other specified prophylactic measures: Secondary | ICD-10-CM | POA: Diagnosis not present

## 2015-08-02 DIAGNOSIS — F988 Other specified behavioral and emotional disorders with onset usually occurring in childhood and adolescence: Secondary | ICD-10-CM | POA: Diagnosis not present

## 2015-08-02 DIAGNOSIS — Z6827 Body mass index (BMI) 27.0-27.9, adult: Secondary | ICD-10-CM | POA: Diagnosis not present

## 2015-08-02 DIAGNOSIS — E7800 Pure hypercholesterolemia, unspecified: Secondary | ICD-10-CM | POA: Diagnosis not present

## 2015-08-02 DIAGNOSIS — E663 Overweight: Secondary | ICD-10-CM | POA: Diagnosis not present

## 2015-08-02 MED FILL — METHYLPHENIDATE ER 54 MG TA: 54 | 90 days supply | Qty: 90 | Fill #0

## 2016-01-24 MED FILL — METHYLPHENIDATE ER 54 MG TA: 54 | 30 days supply | Qty: 30 | Fill #0

## 2016-03-26 MED FILL — METHYLPHENIDATE ER 54 MG TA: 54 | 90 days supply | Qty: 90 | Fill #0

## 2016-08-08 MED FILL — CONCERTA 54 MG TABLET ER: 54 | 90 days supply | Qty: 90 | Fill #0

## 2016-12-30 DIAGNOSIS — Z0000 Encounter for general adult medical examination without abnormal findings: Secondary | ICD-10-CM | POA: Diagnosis not present

## 2016-12-30 DIAGNOSIS — E663 Overweight: Secondary | ICD-10-CM | POA: Diagnosis not present

## 2016-12-30 DIAGNOSIS — F988 Other specified behavioral and emotional disorders with onset usually occurring in childhood and adolescence: Secondary | ICD-10-CM | POA: Diagnosis not present

## 2016-12-30 DIAGNOSIS — E7800 Pure hypercholesterolemia, unspecified: Secondary | ICD-10-CM | POA: Diagnosis not present

## 2017-02-07 MED FILL — CONCERTA 54 MG TABLET ER: 54 | 90 days supply | Qty: 90 | Fill #0

## 2017-06-27 MED FILL — CONCERTA 54 MG TABLET ER: 54 | 30 days supply | Qty: 30 | Fill #0

## 2017-09-10 MED FILL — CONCERTA 54 MG TABLET ER: 54 | 90 days supply | Qty: 90 | Fill #0

## 2017-11-05 ENCOUNTER — Ambulatory Visit: Payer: Self-pay | Admitting: Family Medicine

## 2017-11-05 VITALS — BP 125/85 | HR 85 | Temp 99.9°F | Resp 16 | Wt 206.4 lb

## 2017-11-05 DIAGNOSIS — R059 Cough, unspecified: Secondary | ICD-10-CM

## 2017-11-05 DIAGNOSIS — R0981 Nasal congestion: Secondary | ICD-10-CM

## 2017-11-05 DIAGNOSIS — J029 Acute pharyngitis, unspecified: Secondary | ICD-10-CM

## 2017-11-05 DIAGNOSIS — R05 Cough: Secondary | ICD-10-CM

## 2017-11-05 DIAGNOSIS — H6691 Otitis media, unspecified, right ear: Secondary | ICD-10-CM

## 2017-11-05 LAB — POCT RAPID STREP A (OFFICE): RAPID STREP A SCREEN: NEGATIVE

## 2017-11-05 MED ORDER — IPRATROPIUM BROMIDE 0.06 % NA SOLN: 2.0000 | Freq: Three times a day (TID) | NASAL | 12 refills | Status: DC

## 2017-11-05 MED ORDER — AMOXICILLIN-POT CLAVULANATE 875-125 MG PO TABS
1.0000 | ORAL_TABLET | Freq: Two times a day (BID) | ORAL | 0 refills | Status: DC
Start: 2017-11-05 — End: 2020-02-12

## 2017-11-05 MED ORDER — BENZONATATE 100 MG PO CAPS: 100.0000 mg | ORAL_CAPSULE | Freq: Three times a day (TID) | ORAL | 0 refills | Status: DC | PRN

## 2017-11-05 NOTE — Progress Notes (Signed)
Nicholas Osborne is a 52 y.o. male who presents today with concerns of intermittent sore throat congestion, fever and malaise- that have waxed and waned over the past 7-10 days. Of note patient has history of broken nose that was repaired.  Review of Systems  Constitutional: Negative for chills, fever and malaise/fatigue.  HENT: Positive for congestion, sinus pain and sore throat. Negative for ear discharge and ear pain.   Eyes: Negative.   Respiratory: Negative for cough, sputum production and shortness of breath.   Cardiovascular: Negative.  Negative for chest pain.  Gastrointestinal: Negative for abdominal pain, diarrhea, nausea and vomiting.  Genitourinary: Negative for dysuria, frequency, hematuria and urgency.  Musculoskeletal: Negative for myalgias.  Skin: Negative.   Neurological: Negative for headaches.  Endo/Heme/Allergies: Negative.   Psychiatric/Behavioral: Negative.     O: Vitals:   11/05/17 1952  BP: 125/85  Pulse: 85  Resp: 16  Temp: 99.9 F (37.7 C)  SpO2: 96%     Physical Exam  Constitutional: He is oriented to person, place, and time. Vital signs are normal. He appears well-developed and well-nourished. He is active.  Non-toxic appearance. He does not have a sickly appearance.  HENT:  Head: Normocephalic.  Right Ear: Hearing, external ear and ear canal normal. Tympanic membrane is injected and erythematous.  Left Ear: Hearing, tympanic membrane, external ear and ear canal normal.  Nose: Mucosal edema and rhinorrhea present.  Mouth/Throat: Uvula is midline. Posterior oropharyngeal erythema present. No posterior oropharyngeal edema. Tonsils are 1+ on the right. Tonsils are 1+ on the left. No tonsillar exudate.  Neck: Normal range of motion. Neck supple.  Cardiovascular: Normal rate, regular rhythm, normal heart sounds and normal pulses.  Pulmonary/Chest: Effort normal and breath sounds normal.  Abdominal: Soft. Bowel sounds are normal.  Musculoskeletal: Normal  range of motion.  Lymphadenopathy:       Head (right side): No submental and no submandibular adenopathy present.       Head (left side): No submental and no submandibular adenopathy present.    He has no cervical adenopathy.  Neurological: He is alert and oriented to person, place, and time.  Psychiatric: He has a normal mood and affect.  Vitals reviewed.    A: 1. Sore throat   2. Right otitis media, unspecified otitis media type   3. Nasal congestion   4. Cough      P: Exam findings, diagnosis etiology and medication use and indications reviewed with patient. Follow- Up and discharge instructions provided. No emergent/urgent issues found on exam.  Patient verbalized understanding of information provided and agrees with plan of care (POC), all questions answered.  1. Sore throat - POCT rapid strep A Results for orders placed or performed in visit on 11/05/17 (from the past 24 hour(s))  POCT rapid strep A     Status: Normal   Collection Time: 11/05/17  8:04 PM  Result Value Ref Range   Rapid Strep A Screen Negative Negative     2. Right otitis media, unspecified otitis media type - amoxicillin-clavulanate (AUGMENTIN) 875-125 MG tablet; Take 1 tablet by mouth 2 (two) times daily.  3. Nasal congestion - ipratropium (ATROVENT) 0.06 % nasal spray; Place 2 sprays into both nostrils 3 (three) times daily.  4. Cough - benzonatate (TESSALON) 100 MG capsule; Take 1-2 capsules (100-200 mg total) by mouth 3 (three) times daily as needed for cough (with full glass of water).  Other orders - Pseudoeph-Doxylamine-DM-APAP (DAYQUIL/NYQUIL COLD/FLU RELIEF PO); Take by mouth.

## 2017-11-05 NOTE — Patient Instructions (Signed)

## 2017-12-21 DIAGNOSIS — M9903 Segmental and somatic dysfunction of lumbar region: Secondary | ICD-10-CM | POA: Diagnosis not present

## 2017-12-21 DIAGNOSIS — M62830 Muscle spasm of back: Secondary | ICD-10-CM | POA: Diagnosis not present

## 2017-12-21 DIAGNOSIS — M9902 Segmental and somatic dysfunction of thoracic region: Secondary | ICD-10-CM | POA: Diagnosis not present

## 2017-12-21 DIAGNOSIS — M9905 Segmental and somatic dysfunction of pelvic region: Secondary | ICD-10-CM | POA: Diagnosis not present

## 2017-12-26 DIAGNOSIS — M9902 Segmental and somatic dysfunction of thoracic region: Secondary | ICD-10-CM | POA: Diagnosis not present

## 2017-12-26 DIAGNOSIS — M9903 Segmental and somatic dysfunction of lumbar region: Secondary | ICD-10-CM | POA: Diagnosis not present

## 2017-12-26 DIAGNOSIS — M9905 Segmental and somatic dysfunction of pelvic region: Secondary | ICD-10-CM | POA: Diagnosis not present

## 2017-12-29 DIAGNOSIS — E7800 Pure hypercholesterolemia, unspecified: Secondary | ICD-10-CM | POA: Diagnosis not present

## 2017-12-29 DIAGNOSIS — F901 Attention-deficit hyperactivity disorder, predominantly hyperactive type: Secondary | ICD-10-CM | POA: Diagnosis not present

## 2017-12-29 DIAGNOSIS — Z1212 Encounter for screening for malignant neoplasm of rectum: Secondary | ICD-10-CM | POA: Diagnosis not present

## 2017-12-29 DIAGNOSIS — R7309 Other abnormal glucose: Secondary | ICD-10-CM | POA: Diagnosis not present

## 2017-12-29 DIAGNOSIS — Z1211 Encounter for screening for malignant neoplasm of colon: Secondary | ICD-10-CM | POA: Diagnosis not present

## 2017-12-29 DIAGNOSIS — Z0000 Encounter for general adult medical examination without abnormal findings: Secondary | ICD-10-CM | POA: Diagnosis not present

## 2017-12-29 DIAGNOSIS — E663 Overweight: Secondary | ICD-10-CM | POA: Diagnosis not present

## 2018-03-06 ENCOUNTER — Ambulatory Visit: Payer: 59 | Admitting: Gastroenterology

## 2019-05-14 MED FILL — CHLORHEXIDINE 0.12% RINSE: 0.12 | 15 days supply | Qty: 473 | Fill #0

## 2019-05-14 MED FILL — IBUPROFEN 600 MG TABLET: 600 | 5 days supply | Qty: 20 | Fill #0

## 2019-05-14 MED FILL — AMOXICILLIN 875 MG TABS: 875 | 7 days supply | Qty: 14 | Fill #0

## 2019-10-23 MED FILL — IBUPROFEN 600 MG TABLET: 600 | 5 days supply | Qty: 20 | Fill #0

## 2019-10-23 MED FILL — AMOXICILLIN 875 MG TABS: 875 | 7 days supply | Qty: 14 | Fill #0

## 2019-11-29 DIAGNOSIS — E663 Overweight: Secondary | ICD-10-CM | POA: Diagnosis not present

## 2019-11-29 DIAGNOSIS — F901 Attention-deficit hyperactivity disorder, predominantly hyperactive type: Secondary | ICD-10-CM | POA: Diagnosis not present

## 2019-11-29 DIAGNOSIS — Z1212 Encounter for screening for malignant neoplasm of rectum: Secondary | ICD-10-CM | POA: Diagnosis not present

## 2019-11-29 DIAGNOSIS — Z1211 Encounter for screening for malignant neoplasm of colon: Secondary | ICD-10-CM | POA: Diagnosis not present

## 2019-11-29 DIAGNOSIS — Z0000 Encounter for general adult medical examination without abnormal findings: Secondary | ICD-10-CM | POA: Diagnosis not present

## 2019-11-29 DIAGNOSIS — E7800 Pure hypercholesterolemia, unspecified: Secondary | ICD-10-CM | POA: Diagnosis not present

## 2019-11-30 MED FILL — CONCERTA 54 MG TABLET ER: 54 | 90 days supply | Qty: 90 | Fill #0

## 2019-11-30 MED FILL — SHINGRIX 50 MCG SUS: 50 | 1 days supply | Qty: 1 | Fill #0

## 2019-12-16 ENCOUNTER — Encounter: Payer: Self-pay | Admitting: Gastroenterology

## 2020-01-26 ENCOUNTER — Ambulatory Visit: Payer: 59 | Admitting: Family Medicine

## 2020-02-12 ENCOUNTER — Encounter: Payer: Self-pay | Admitting: Gastroenterology

## 2020-02-12 ENCOUNTER — Other Ambulatory Visit: Payer: Self-pay

## 2020-02-12 ENCOUNTER — Ambulatory Visit (AMBULATORY_SURGERY_CENTER): Payer: Self-pay | Admitting: *Deleted

## 2020-02-12 VITALS — Ht 71.5 in | Wt 206.0 lb

## 2020-02-12 DIAGNOSIS — Z1211 Encounter for screening for malignant neoplasm of colon: Secondary | ICD-10-CM

## 2020-02-12 MED ORDER — NA SULFATE-K SULFATE-MG SULF 17.5-3.13-1.6 GM/177ML PO SOLN: ORAL | 0 refills | Status: DC

## 2020-02-12 NOTE — Progress Notes (Signed)
Patient is here in-person for PV. Patient denies any allergies to eggs or soy. Patient denies any problems with anesthesia/sedation. Patient denies any oxygen use at home. Patient denies taking any diet/weight loss medications or blood thinners. Patient is not being treated for MRSA or C-diff. Patient is aware of our care-partner policy and EGBTD-17 safety protocol. EMMI education assisgned to the patient for the procedure, sent to MyChart.   COVID-19 vaccines completed on 06/22/19, per patient.   Prep Prescription coupon given to the patient.

## 2020-02-22 MED FILL — SHINGRIX 50 MCG SUS: 50 | 1 days supply | Qty: 1 | Fill #1

## 2020-02-22 MED FILL — SUPREP BOWEL PREP KIT: 17.5-3.13-1 | 1 days supply | Qty: 354 | Fill #0

## 2020-02-26 ENCOUNTER — Other Ambulatory Visit: Payer: Self-pay

## 2020-02-26 ENCOUNTER — Ambulatory Visit (AMBULATORY_SURGERY_CENTER): Payer: 59 | Admitting: Gastroenterology

## 2020-02-26 ENCOUNTER — Encounter: Payer: Self-pay | Admitting: Gastroenterology

## 2020-02-26 VITALS — BP 104/70 | HR 65 | Temp 97.3°F | Resp 13 | Ht 71.5 in | Wt 206.0 lb

## 2020-02-26 DIAGNOSIS — D125 Benign neoplasm of sigmoid colon: Secondary | ICD-10-CM

## 2020-02-26 DIAGNOSIS — K635 Polyp of colon: Secondary | ICD-10-CM | POA: Diagnosis not present

## 2020-02-26 DIAGNOSIS — E669 Obesity, unspecified: Secondary | ICD-10-CM | POA: Diagnosis not present

## 2020-02-26 DIAGNOSIS — Z1211 Encounter for screening for malignant neoplasm of colon: Secondary | ICD-10-CM

## 2020-02-26 MED ORDER — SODIUM CHLORIDE 0.9 % IV SOLN: 500.0000 mL | Freq: Once | INTRAVENOUS | Status: DC

## 2020-02-26 NOTE — Progress Notes (Signed)
Pt's states no medical or surgical changes since previsit or office visit. 

## 2020-02-26 NOTE — Patient Instructions (Signed)
Handout on polyps ,diverticulosis,& hemorrhoids given to you today  Await pathology results on polyp removed     YOU HAD AN ENDOSCOPIC PROCEDURE TODAY AT [facility]:   Refer to the procedure report that was given to you for any specific questions about what was found during the examination.  If the procedure report does not answer your questions, please call your gastroenterologist to clarify.  If you requested that your care partner not be given the details of your procedure findings, then the procedure report has been included in a sealed envelope for you to review at your convenience later.  YOU SHOULD EXPECT: Some feelings of bloating in the abdomen. Passage of more gas than usual.  Walking can help get rid of the air that was put into your GI tract during the procedure and reduce the bloating. If you had a lower endoscopy (such as a colonoscopy or flexible sigmoidoscopy) you may notice spotting of blood in your stool or on the toilet paper. If you underwent a bowel prep for your procedure, you may not have a normal bowel movement for a few days.  Please Note:  You might notice some irritation and congestion in your nose or some drainage.  This is from the oxygen used during your procedure.  There is no need for concern and it should clear up in a day or so.  SYMPTOMS TO REPORT IMMEDIATELY:   Following lower endoscopy (colonoscopy or flexible sigmoidoscopy):  Excessive amounts of blood in the stool  Significant tenderness or worsening of abdominal pains  Swelling of the abdomen that is new, acute  Fever of 100F or higher   For urgent or emergent issues, a gastroenterologist can be reached at any hour by calling [phone]. Do not use MyChart messaging for urgent concerns.    DIET:  We do recommend a small meal at first, but then you may proceed to your regular diet.  Drink plenty of fluids but you should avoid alcoholic beverages for 24 hours.  ACTIVITY:   You should plan to take it easy for the rest of today and you should NOT DRIVE or use heavy machinery until tomorrow (because of the sedation medicines used during the test).    FOLLOW UP: Our staff will call the number listed on your records 48-72 hours following your procedure to check on you and address any questions or concerns that you may have regarding the information given to you following your procedure. If we do not reach you, we will leave a message.  We will attempt to reach you two times.  During this call, we will ask if you have developed any symptoms of COVID 19. If you develop any symptoms (ie: fever, flu-like symptoms, shortness of breath, cough etc.) before then, please call [phone].  If you test positive for Covid 19 in the 2 weeks post procedure, please call and report this information to [place].    If any biopsies were taken you will be contacted by phone or by letter within the next 1-3 weeks.  Please call us at [phone] if you have not heard about the biopsies in 3 weeks.    SIGNATURES/CONFIDENTIALITY: You and/or your care partner have signed paperwork which will be entered into your electronic medical record.  These signatures attest to the fact that that the information above on your After Visit Summary has been reviewed and is understood.  Full responsibility of the confidentiality of this discharge information lies with you  and/or your care-partner. 

## 2020-02-26 NOTE — Progress Notes (Signed)
Called to room to assist during endoscopic procedure.  Patient ID and intended procedure confirmed with present staff. Received instructions for my participation in the procedure from the performing physician.  

## 2020-02-26 NOTE — Op Note (Signed)
[name] Procedure Date: 02/26/2020 9:00 AM MRN: [id number] Endoscopist: Mauri Pole , MD Age: 54 Referring MD:  Date of Birth: 28-Oct-1965 Gender: Male Account #: [id number] Procedure:                Colonoscopy Indications:              Screening for colorectal malignant neoplasm Medicines:                Monitored Anesthesia Care Procedure:                Pre-Anesthesia Assessment:                           - Prior to the procedure, a History and Physical                            was performed, and patient medications and                            allergies were reviewed. The patient's tolerance of                            previous anesthesia was also reviewed. The risks                            and benefits of the procedure and the sedation                            options and risks were discussed with the patient.                            All questions were answered, and informed consent                            was obtained. Prior Anticoagulants: The patient has                            taken no previous anticoagulant or antiplatelet                            agents. ASA Grade Assessment: II - A patient with                            mild systemic disease. After reviewing the risks                            and benefits, the patient was deemed in                            satisfactory condition to undergo the procedure.                           After obtaining informed consent, the colonoscope  was passed under direct vision. Throughout the                            procedure, the patient's blood pressure, pulse, and                            oxygen saturations were monitored continuously. The                            Colonoscope was introduced through the anus and                            advanced to the the cecum, identified by                            appendiceal orifice and  ileocecal valve. The                            quality of the bowel preparation was excellent. The                            ileocecal valve, appendiceal orifice, and rectum                            were photographed. Scope In: 9:04:18 AM Scope Out: 9:17:30 AM Scope Withdrawal Time: 0 hours 10 minutes 14 seconds  Total Procedure Duration: 0 hours 13 minutes 12 seconds  Findings:                 The perianal and digital rectal examinations were                            normal.                           A 5 mm polyp was found in the sigmoid colon. The                            polyp was sessile. The polyp was removed with a                            cold snare. Resection and retrieval were complete.                           A few small-mouthed diverticula were found in the                            sigmoid colon.                           Non-bleeding internal hemorrhoids were found during                            retroflexion. The hemorrhoids were small.  The exam was otherwise without abnormality. Complications:            No immediate complications. Estimated Blood Loss:     Estimated blood loss was minimal. Impression:               - One 5 mm polyp in the sigmoid colon, removed with                            a cold snare. Resected and retrieved.                           - Mild diverticulosis in the sigmoid colon.                           - Non-bleeding internal hemorrhoids.                           - The examination was otherwise normal. Recommendation:           - Patient has a contact number available for                            emergencies. The signs and symptoms of potential                            delayed complications were discussed with the                            patient. Return to normal activities tomorrow.                            Written discharge instructions were provided to the                            patient.                            - Resume previous diet.                           - Continue present medications.                           - Await pathology results.                           - Repeat colonoscopy in 5-10 years for surveillance                            based on pathology results. Mauri Pole, MD 02/26/2020 9:24:41 AM This report has been signed electronically.

## 2020-02-26 NOTE — Progress Notes (Signed)
Report to PACU, RN, vss, BBS= Clear.  

## 2020-03-01 ENCOUNTER — Telehealth: Payer: Self-pay | Admitting: *Deleted

## 2020-03-01 NOTE — Telephone Encounter (Signed)
Left message on f/u call 

## 2020-03-10 ENCOUNTER — Encounter: Payer: Self-pay | Admitting: Gastroenterology

## 2020-09-22 ENCOUNTER — Other Ambulatory Visit (HOSPITAL_COMMUNITY): Payer: Self-pay

## 2020-09-22 MED ORDER — METHYLPHENIDATE HCL ER (OSM) 54 MG PO TBCR
EXTENDED_RELEASE_TABLET | ORAL | 0 refills | Status: DC
  Filled 2020-09-22: qty 90, 90d supply, fill #0

## 2020-09-23 ENCOUNTER — Other Ambulatory Visit (HOSPITAL_COMMUNITY): Payer: Self-pay

## 2020-10-29 DIAGNOSIS — Z0000 Encounter for general adult medical examination without abnormal findings: Secondary | ICD-10-CM | POA: Diagnosis not present

## 2020-10-29 DIAGNOSIS — E663 Overweight: Secondary | ICD-10-CM | POA: Diagnosis not present

## 2020-10-29 DIAGNOSIS — F909 Attention-deficit hyperactivity disorder, unspecified type: Secondary | ICD-10-CM | POA: Diagnosis not present

## 2020-10-29 DIAGNOSIS — E785 Hyperlipidemia, unspecified: Secondary | ICD-10-CM | POA: Diagnosis not present

## 2020-10-29 DIAGNOSIS — F988 Other specified behavioral and emotional disorders with onset usually occurring in childhood and adolescence: Secondary | ICD-10-CM | POA: Diagnosis not present

## 2020-10-29 DIAGNOSIS — Z6828 Body mass index (BMI) 28.0-28.9, adult: Secondary | ICD-10-CM | POA: Diagnosis not present

## 2020-10-29 DIAGNOSIS — E7800 Pure hypercholesterolemia, unspecified: Secondary | ICD-10-CM | POA: Diagnosis not present

## 2021-01-05 ENCOUNTER — Other Ambulatory Visit (HOSPITAL_COMMUNITY): Payer: Self-pay

## 2021-01-05 MED ORDER — CARESTART COVID-19 HOME TEST VI KIT
PACK | 0 refills | Status: DC
  Filled 2021-01-05: qty 4, 4d supply, fill #0

## 2021-02-16 ENCOUNTER — Other Ambulatory Visit (HOSPITAL_COMMUNITY): Payer: Self-pay

## 2021-02-16 MED ORDER — METHYLPHENIDATE HCL ER (OSM) 54 MG PO TBCR
EXTENDED_RELEASE_TABLET | ORAL | 0 refills | Status: AC
  Filled 2021-02-16: qty 90, 90d supply, fill #0

## 2021-02-20 ENCOUNTER — Other Ambulatory Visit (HOSPITAL_COMMUNITY): Payer: Self-pay

## 2021-07-07 ENCOUNTER — Ambulatory Visit (INDEPENDENT_AMBULATORY_CARE_PROVIDER_SITE_OTHER): Payer: 59

## 2021-07-07 ENCOUNTER — Encounter: Payer: Self-pay | Admitting: Adult Health

## 2021-07-07 ENCOUNTER — Other Ambulatory Visit: Payer: Self-pay

## 2021-07-07 ENCOUNTER — Other Ambulatory Visit (HOSPITAL_COMMUNITY): Payer: Self-pay

## 2021-07-07 ENCOUNTER — Ambulatory Visit (INDEPENDENT_AMBULATORY_CARE_PROVIDER_SITE_OTHER): Payer: 59 | Admitting: Adult Health

## 2021-07-07 VITALS — BP 104/70 | HR 86 | Temp 98.5°F | Ht 72.0 in | Wt 203.4 lb

## 2021-07-07 DIAGNOSIS — R0602 Shortness of breath: Secondary | ICD-10-CM

## 2021-07-07 DIAGNOSIS — U071 COVID-19: Secondary | ICD-10-CM | POA: Diagnosis not present

## 2021-07-07 DIAGNOSIS — R000 Tachycardia, unspecified: Secondary | ICD-10-CM

## 2021-07-07 DIAGNOSIS — R3915 Urgency of urination: Secondary | ICD-10-CM | POA: Diagnosis not present

## 2021-07-07 DIAGNOSIS — R059 Cough, unspecified: Secondary | ICD-10-CM | POA: Diagnosis not present

## 2021-07-07 LAB — URINALYSIS, ROUTINE W REFLEX MICROSCOPIC
Bilirubin Urine: NEGATIVE
Hgb urine dipstick: NEGATIVE
Ketones, ur: NEGATIVE
Leukocytes,Ua: NEGATIVE
Nitrite: NEGATIVE
Specific Gravity, Urine: 1.01 (ref 1.000–1.030)
Total Protein, Urine: NEGATIVE
Urine Glucose: NEGATIVE
Urobilinogen, UA: 0.2 (ref 0.0–1.0)
pH: 5.5 (ref 5.0–8.0)

## 2021-07-07 LAB — CBC WITH DIFFERENTIAL/PLATELET
Basophils Absolute: 0 10*3/uL (ref 0.0–0.1)
Basophils Relative: 0.2 % (ref 0.0–3.0)
Eosinophils Absolute: 0 10*3/uL (ref 0.0–0.7)
Eosinophils Relative: 0.3 % (ref 0.0–5.0)
HCT: 43 % (ref 39.0–52.0)
Hemoglobin: 14.7 g/dL (ref 13.0–17.0)
Lymphocytes Relative: 12.2 % (ref 12.0–46.0)
Lymphs Abs: 1.7 10*3/uL (ref 0.7–4.0)
MCHC: 34.2 g/dL (ref 30.0–36.0)
MCV: 86.1 fl (ref 78.0–100.0)
Monocytes Absolute: 1.1 10*3/uL — ABNORMAL HIGH (ref 0.1–1.0)
Monocytes Relative: 8.1 % (ref 3.0–12.0)
Neutro Abs: 10.8 10*3/uL — ABNORMAL HIGH (ref 1.4–7.7)
Neutrophils Relative %: 79.2 % — ABNORMAL HIGH (ref 43.0–77.0)
Platelets: 254 10*3/uL (ref 150.0–400.0)
RBC: 4.99 Mil/uL (ref 4.22–5.81)
RDW: 12.2 % (ref 11.5–15.5)
WBC: 13.6 10*3/uL — ABNORMAL HIGH (ref 4.0–10.5)

## 2021-07-07 LAB — POCT INFLUENZA A/B
Influenza A, POC: NEGATIVE
Influenza B, POC: NEGATIVE

## 2021-07-07 LAB — BASIC METABOLIC PANEL
BUN: 13 mg/dL (ref 6–23)
CO2: 29 mEq/L (ref 19–32)
Calcium: 9.7 mg/dL (ref 8.4–10.5)
Chloride: 100 mEq/L (ref 96–112)
Creatinine, Ser: 1.17 mg/dL (ref 0.40–1.50)
GFR: 71.24 mL/min (ref >60.00)
Glucose, Bld: 102 mg/dL — ABNORMAL HIGH (ref 70–99)
Potassium: 4.5 mEq/L (ref 3.5–5.1)
Sodium: 137 mEq/L (ref 135–145)

## 2021-07-07 LAB — BRAIN NATRIURETIC PEPTIDE: Pro B Natriuretic peptide (BNP): 17 pg/mL (ref 0.0–100.0)

## 2021-07-07 LAB — TSH: TSH: 1.41 u[IU]/mL (ref 0.35–5.50)

## 2021-07-07 MED ORDER — ALBUTEROL SULFATE HFA 108 (90 BASE) MCG/ACT IN AERS
1.0000 | INHALATION_SPRAY | Freq: Four times a day (QID) | RESPIRATORY_TRACT | 2 refills | Status: DC | PRN
  Filled 2021-07-07: qty 8.5, 25d supply, fill #0

## 2021-07-07 MED ORDER — PREDNISONE 10 MG PO TABS
ORAL_TABLET | ORAL | 0 refills | Status: DC
  Filled 2021-07-07: qty 20, 8d supply, fill #0

## 2021-07-07 NOTE — Assessment & Plan Note (Signed)
Intermittent tachycardia suspect is secondary to viral illness and fevers.  Also could be side effect of over-the-counter sinus medicines. Today in the office heart rate is normal and regular. Lab work is pending Recommend fluids and rest.  Continue to monitor if persist will need further evaluation with possible EKG and referral to cardiology.  Plan  Patient Instructions  Prednisone taper over next week .  Mucinex DM Twice daily  As needed  cough/congestion  Saline nasal rinses As needed   Albuterol inhaler 1-2 puffs every 6hr as needed .  Fluids and rest  Labs today .  Follow up with Dr. Halford Chessman  or Jadrien Narine NP in 6-8 weeks and As needed   Please contact office for sooner follow up if symptoms do not improve or worsen or seek emergency care

## 2021-07-07 NOTE — Patient Instructions (Signed)
Prednisone taper over next week .  Mucinex DM Twice daily  As needed  cough/congestion  Saline nasal rinses As needed   Albuterol inhaler 1-2 puffs every 6hr as needed .  Fluids and rest  Labs today .  Follow up with Dr. Halford Chessman  or Eirene Rather NP in 6-8 weeks and As needed   Please contact office for sooner follow up if symptoms do not improve or worsen or seek emergency care

## 2021-07-07 NOTE — Assessment & Plan Note (Signed)
Lingering viral symptoms with fever, body aches, fatigue and general malaise. Chest x-ray today is clear with no signs of pneumonia.  D-dimer is negative low suspicion for PE. May have a component of some reactive airways -we will give a short course of prednisone.  An albuterol inhaler as needed Continue with symptomatic treatment along with supportive care.  Plan  Patient Instructions  Prednisone taper over next week .  Mucinex DM Twice daily  As needed  cough/congestion  Saline nasal rinses As needed   Albuterol inhaler 1-2 puffs every 6hr as needed .  Fluids and rest  Labs today .  Follow up with Dr. Halford Chessman  or Sera Hitsman NP in 6-8 weeks and As needed   Please contact office for sooner follow up if symptoms do not improve or worsen or seek emergency care

## 2021-07-07 NOTE — Progress Notes (Signed)
_0  ID: Nicholas Osborne, male    DOB: 1965/08/13, 56 y.o.   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  Patient complains that on June 23, 2020 he developed flulike symptoms with fever, body aches chills cough and congestion.  Patient tested positive for COVID-19 on June 23, 2020.  Patient been vaccinated for COVID x2.  Patient says he had what he describes as typical cold and flulike symptoms for several days that were manageable with over-the-counter cold products.  Patient says he drink lots of fluid.  And works from home.  Patient took Mucinex cold and sinus for symptom control.  Patient's entire family wife and 2 daughters all had COVID as well. Patient says that he returned back to work with some mild symptoms of nasal congestion and drainage general malaise but for the most part was feeling better.  3 days ago symptoms restarted and started to ramp up with a fever body aches chills, nasal congestion.  Patient also had tachycardia with heart rate around 110-115.  Also has some intermittent chest tightness and shortness of breath.  Patient has no discolored mucus.  No hemoptysis no calf pain.  Did notice some urinary urgency that began yesterday.  He denies any back pain or nausea vomiting.  Patient has taken some over-the-counter cold medicines and Mucinex cold and sinus last night says that he had sweats restless sleep.  He denies any syncope.   Flu test today in the office is negative.  Social history.  Patient is married.  He works for [employer].  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  Rare alcohol.  Never smoker.  He is married.  Has 2 daughters.    Medical history significant for ADHD controlled on medications.  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  Received INH.  He [place].   Nasal fracture with deviated septum status post sinus surgery.    FH : DM,HTN     No Known Allergies  Immunization History  Administered Date(s) Administered   Influenza-Unspecified 02/17/2018, 03/13/2019, 04/01/2020, 03/24/2021   MMR 11/13/2017   Tdap 07/12/2011   Zoster Recombinat (Shingrix) 12/09/2019, 02/23/2020    Past Medical History:  Diagnosis Date   ADHD     Tobacco History: Social History   Tobacco Use  Smoking Status Never  Smokeless Tobacco Never   Counseling given: Not Answered   Outpatient Medications Prior to Visit  Medication Sig Dispense Refill   COVID-19 At Home Antigen Test (CARESTART COVID-19 HOME TEST) KIT Use as directed 4 each 0   methylphenidate 54 MG PO CR tablet Take 54 mg by mouth every morning.     methylphenidate 54 MG PO CR tablet Take 1 tablet by mouth once in the morning 90 tablet 0   methylphenidate 54 MG PO CR tablet Take 1 tablet by mouth every morning. 90 tablet 0   No facility-administered medications prior to visit.     Review of Systems:   Constitutional:   No  weight loss, night sweats,   +Fevers, chills, fatigue, or  lassitude.  HEENT:  No headaches,  Difficulty swallowing,  Tooth/dental problems, or  Sore throat,                No sneezing, itching, ear ache,  +nasal congestion, post nasal drip,   CV:  No chest pain,  Orthopnea, PND, swelling in lower extremities, anasarca, dizziness, palpitations, syncope.   GI  No heartburn, indigestion, abdominal pain, nausea, vomiting, diarrhea, change in bowel habits, loss of appetite, bloody stools.   Resp:   No chest wall deformity  Skin: no rash or lesions.  GU: no dysuria, change in color of urine, no urgency or frequency.  No flank pain, no  hematuria   MS:  No joint pain or swelling.  No decreased range of motion.  No back pain.    Physical Exam  BP 104/70 (BP Location: Left Arm, Patient Position: Sitting, Cuff Size: Normal)    Pulse 86    Temp 98.5 F (36.9 C) (Oral)    Ht 6' (1.829 m)    Wt 203 lb 6.4 oz (92.3 kg)    SpO2 98%    BMI 27.59 kg/m   GEN: A/Ox3; pleasant , NAD, well nourished    HEENT:  [place]/AT,  EACs-clear, TMs-wnl, NOSE-clear, THROAT-clear, no lesions, no postnasal drip or exudate noted.   NECK:  Supple w/ fair ROM; no JVD; normal carotid impulses w/o bruits; no thyromegaly or nodules palpated; no lymphadenopathy.    RESP  Clear  P & A; w/o, wheezes/ rales/ or rhonchi. no accessory muscle use, no dullness to percussion  CARD:  RRR, no m/r/g, no peripheral edema, pulses intact, no cyanosis or clubbing. Neg calf pain , neg homans sign .   GI:   Soft & nt; nml bowel sounds; no organomegaly or masses detected.   Musco: Warm bil, no deformities or joint swelling noted.   Neuro: alert, no focal deficits noted.    Skin: Warm, no lesions or rashes    Lab Results:   BNP No results found for: BNP  ProBNP    Component Value Date/Time   PROBNP 17.0 07/07/2021 1253    Imaging: DG Chest 2 View  Result Date: 07/07/2021 CLINICAL DATA:  Cough, COVID positive EXAM: CHEST - 2 VIEW COMPARISON:  05/18/2005 FINDINGS: The heart size and mediastinal contours are within normal limits. Both lungs are clear. The visualized skeletal structures are unremarkable. IMPRESSION: No active cardiopulmonary disease. Electronically Signed   By: Elmer Picker M.D.   On: 07/07/2021 12:51      No flowsheet data found.  No results found for: NITRICOXIDE      Assessment & Plan:   COVID-19 virus infection Lingering viral symptoms with fever, body aches, fatigue and general malaise. Chest x-ray today is clear with no signs of pneumonia.  D-dimer is negative low suspicion for PE. May have a component of some  reactive airways -we will give a short course of prednisone.  An albuterol inhaler as needed Continue with symptomatic treatment along with supportive care.  Plan  Patient Instructions  Prednisone taper over next week .  Mucinex DM Twice daily  As needed  cough/congestion  Saline nasal rinses As needed   Albuterol inhaler 1-2 puffs every 6hr as needed .  Fluids and rest  Labs today .  Follow up with Dr. Halford Chessman  or Kalilah Barua NP in 6-8 weeks and As needed   Please contact office for sooner follow up if symptoms do not improve or worsen or seek emergency care  Tachycardia Intermittent tachycardia suspect is secondary to viral illness and fevers.  Also could be side effect of over-the-counter sinus medicines. Today in the office heart rate is normal and regular. Lab work is pending Recommend fluids and rest.  Continue to monitor if persist will need further evaluation with possible EKG and referral to cardiology.  Plan  Patient Instructions  Prednisone taper over next week .  Mucinex DM Twice daily  As needed  cough/congestion  Saline nasal rinses As needed   Albuterol inhaler 1-2 puffs every 6hr as needed .  Fluids and rest  Labs today .  Follow up with Dr. Halford Chessman  or Kima Malenfant NP in 6-8 weeks and As needed   Please contact office for sooner follow up if symptoms do not improve or worsen or seek emergency care       Urinary urgency Check UA /Urine Cx  Push fluids     Rexene Edison, NP 07/07/2021

## 2021-07-07 NOTE — Assessment & Plan Note (Signed)
Check UA /Urine Cx  Push fluids

## 2021-07-09 LAB — URINE CULTURE
MICRO NUMBER:: 12961165
SPECIMEN QUALITY:: ADEQUATE

## 2021-07-09 LAB — D-DIMER, QUANTITATIVE: D-Dimer, Quant: 0.34 mcg/mL FEU (ref <0.50)

## 2021-07-10 ENCOUNTER — Other Ambulatory Visit (HOSPITAL_COMMUNITY): Payer: Self-pay

## 2021-07-10 MED ORDER — NITROFURANTOIN MONOHYD MACRO 100 MG PO CAPS
100.0000 mg | ORAL_CAPSULE | Freq: Two times a day (BID) | ORAL | 0 refills | Status: DC
  Filled 2021-07-10: qty 14, 7d supply, fill #0

## 2021-07-10 NOTE — Addendum Note (Signed)
Addended by: Melvenia Needles on: 07/10/2021 08:37 AM   Modules accepted: Orders

## 2021-07-10 NOTE — Progress Notes (Signed)
Reviewed and agree with assessment/plan.   Chesley Mires, MD [facility] Pulmonary/Critical Care 07/10/2021, 8:36 AM Pager:  [phone]

## 2021-07-21 ENCOUNTER — Ambulatory Visit: Payer: 59 | Admitting: Podiatry

## 2021-07-28 ENCOUNTER — Other Ambulatory Visit: Payer: Self-pay

## 2021-07-28 ENCOUNTER — Ambulatory Visit (INDEPENDENT_AMBULATORY_CARE_PROVIDER_SITE_OTHER): Payer: 59 | Admitting: Podiatry

## 2021-07-28 ENCOUNTER — Ambulatory Visit (INDEPENDENT_AMBULATORY_CARE_PROVIDER_SITE_OTHER): Payer: 59

## 2021-07-28 DIAGNOSIS — M79671 Pain in right foot: Secondary | ICD-10-CM

## 2021-07-28 DIAGNOSIS — T148XXA Other injury of unspecified body region, initial encounter: Secondary | ICD-10-CM | POA: Diagnosis not present

## 2021-07-28 DIAGNOSIS — M779 Enthesopathy, unspecified: Secondary | ICD-10-CM

## 2021-07-31 ENCOUNTER — Other Ambulatory Visit (HOSPITAL_COMMUNITY): Payer: Self-pay

## 2021-07-31 MED ORDER — METHYLPHENIDATE HCL ER (OSM) 54 MG PO TBCR
EXTENDED_RELEASE_TABLET | ORAL | 0 refills | Status: AC
  Filled 2021-07-31: qty 90, 90d supply, fill #0

## 2021-08-02 ENCOUNTER — Other Ambulatory Visit (HOSPITAL_COMMUNITY): Payer: Self-pay

## 2021-08-03 ENCOUNTER — Encounter: Payer: Self-pay | Admitting: Podiatry

## 2021-08-03 NOTE — Progress Notes (Signed)
Subjective:  Patient ID: Nicholas Osborne, male    DOB: 05/13/1966,  MRN: [id number]  No chief complaint on file.   56 y.o. male presents with the above complaint.  Patient presents with complaint of right fifth metatarsal bone bruise.  Patient states that has been going for quite some time has progressive gotten worse.  Is been going for a year.  Patient states that sometimes he will get numbness at the ball of the foot.  Is likely due to compensation.  Pain is about 7 out of 10 hurts with ambulation dull achy in nature he has not tried anything for it.  He does wear or used to wear dress shoes.  He does a lot on his foot.   Review of Systems: Negative except as noted in the HPI. Denies N/V/F/Ch.  Past Medical History:  Diagnosis Date   ADHD     Current Outpatient Medications:    albuterol (VENTOLIN HFA) 108 (90 Base) MCG/ACT inhaler, Inhale 1-2 puffs into the lungs every 6 (six) hours as needed., Disp: 8 g, Rfl: 2   COVID-19 At Home Antigen Test (CARESTART COVID-19 HOME TEST) KIT, Use as directed, Disp: 4 each, Rfl: 0   methylphenidate 54 MG PO CR tablet, Take 54 mg by mouth every morning., Disp: , Rfl:    methylphenidate 54 MG PO CR tablet, Take 1 tablet by mouth once in the morning, Disp: 90 tablet, Rfl: 0   methylphenidate 54 MG PO CR tablet, Take 1 tablet by mouth every morning., Disp: 90 tablet, Rfl: 0   methylphenidate (CONCERTA) 54 MG PO CR tablet, Take 1 tablet by mouth every morning., Disp: 90 tablet, Rfl: 0   nitrofurantoin, macrocrystal-monohydrate, (MACROBID) 100 MG capsule, Take 1 capsule by mouth 2 times daily., Disp: 14 capsule, Rfl: 0   predniSONE (DELTASONE) 10 MG tablet, Take 4 tablets by mouth daily for 2 days then decrease by 1 tablet every other day (4-4-3-3-2-2-1-1), Disp: 20 tablet, Rfl: 0  Social History   Tobacco Use  Smoking Status Never  Smokeless Tobacco Never    No Known Allergies Objective:  There were no vitals filed for this visit. There is no  height or weight on file to calculate BMI. Constitutional Well developed. Well nourished.  Vascular Dorsalis pedis pulses palpable bilaterally. Posterior tibial pulses palpable bilaterally. Capillary refill normal to all digits.  No cyanosis or clubbing noted. Pedal hair growth normal.  Neurologic Normal speech. Oriented to person, place, and time. Epicritic sensation to light touch grossly present bilaterally.  Dermatologic Nails well groomed and normal in appearance. No open wounds. No skin lesions.  Orthopedic: Pain along the course of the fifth metatarsal.  No pain at the base of the fifth metatarsal as well as fifth metatarsal phalangeal joint.  No extensor or flexor tendinitis noted.   Radiographs: 3 views of skeletally mature the right foot: No signs of stress fracture noted.  No periosteal reaction noted.  No bony abnormalities identified.  No osteoarthritis noted. Assessment:   1. Bone bruise    Plan:  Patient was evaluated and treated and all questions answered.  Right fifth metatarsal bone bruise -All questions and concerns were discussed with the patient in extensive detail -I discussed with the patient is probably the mechanics of his foot structure leading to excessive stress on the outside part of the foot especially while ambulating I believe that he may have led to bruising of the bone.  At this time I believe patient would benefit from cam  boot immobilization to allow the soft tissue structure as well as bone structure to heal.  Patient agrees with plan like to proceed with cam boot immobilization -Cam boot was dispensed  No follow-ups on file.

## 2021-08-25 ENCOUNTER — Ambulatory Visit: Payer: 59 | Admitting: Podiatry

## 2021-11-18 ENCOUNTER — Other Ambulatory Visit (HOSPITAL_COMMUNITY): Payer: Self-pay

## 2021-11-18 DIAGNOSIS — Z760 Encounter for issue of repeat prescription: Secondary | ICD-10-CM | POA: Diagnosis not present

## 2021-11-18 DIAGNOSIS — Z1329 Encounter for screening for other suspected endocrine disorder: Secondary | ICD-10-CM | POA: Diagnosis not present

## 2021-11-18 DIAGNOSIS — F909 Attention-deficit hyperactivity disorder, unspecified type: Secondary | ICD-10-CM | POA: Diagnosis not present

## 2021-11-18 DIAGNOSIS — Z0000 Encounter for general adult medical examination without abnormal findings: Secondary | ICD-10-CM | POA: Diagnosis not present

## 2021-11-18 DIAGNOSIS — Z79899 Other long term (current) drug therapy: Secondary | ICD-10-CM | POA: Diagnosis not present

## 2021-11-18 DIAGNOSIS — Z13220 Encounter for screening for lipoid disorders: Secondary | ICD-10-CM | POA: Diagnosis not present

## 2021-11-18 DIAGNOSIS — F900 Attention-deficit hyperactivity disorder, predominantly inattentive type: Secondary | ICD-10-CM | POA: Diagnosis not present

## 2021-11-18 DIAGNOSIS — Z13228 Encounter for screening for other metabolic disorders: Secondary | ICD-10-CM | POA: Diagnosis not present

## 2021-11-18 DIAGNOSIS — Z8616 Personal history of COVID-19: Secondary | ICD-10-CM | POA: Diagnosis not present

## 2021-11-18 DIAGNOSIS — E663 Overweight: Secondary | ICD-10-CM | POA: Diagnosis not present

## 2021-11-18 DIAGNOSIS — E7800 Pure hypercholesterolemia, unspecified: Secondary | ICD-10-CM | POA: Diagnosis not present

## 2021-11-18 DIAGNOSIS — Z130 Encounter for screening for diseases of the blood and blood-forming organs and certain disorders involving the immune mechanism: Secondary | ICD-10-CM | POA: Diagnosis not present

## 2021-11-18 MED ORDER — METHYLPHENIDATE HCL ER (OSM) 54 MG PO TBCR
54.0000 mg | EXTENDED_RELEASE_TABLET | Freq: Every morning | ORAL | 0 refills | Status: AC
  Filled 2021-11-18: qty 90, 90d supply, fill #0

## 2022-04-05 ENCOUNTER — Other Ambulatory Visit (HOSPITAL_COMMUNITY): Payer: Self-pay

## 2022-04-05 MED ORDER — METHYLPHENIDATE HCL ER (OSM) 54 MG PO TBCR
54.0000 mg | EXTENDED_RELEASE_TABLET | ORAL | 0 refills | Status: AC
  Filled 2022-04-05: qty 90, 90d supply, fill #0

## 2022-08-07 ENCOUNTER — Other Ambulatory Visit (HOSPITAL_COMMUNITY): Payer: Self-pay

## 2022-08-07 MED ORDER — METHYLPHENIDATE HCL ER (OSM) 54 MG PO TBCR
54.0000 mg | EXTENDED_RELEASE_TABLET | Freq: Every morning | ORAL | 0 refills | Status: AC
  Filled 2022-08-07: qty 90, 90d supply, fill #0

## 2022-08-15 ENCOUNTER — Other Ambulatory Visit (HOSPITAL_COMMUNITY): Payer: Self-pay

## 2022-10-16 ENCOUNTER — Other Ambulatory Visit (HOSPITAL_COMMUNITY): Payer: Self-pay

## 2022-10-16 MED ORDER — METHYLPHENIDATE HCL ER (OSM) 54 MG PO TBCR
54.0000 mg | EXTENDED_RELEASE_TABLET | Freq: Every morning | ORAL | 0 refills | Status: AC
  Filled 2022-10-16: qty 90, 90d supply, fill #0

## 2022-10-24 ENCOUNTER — Other Ambulatory Visit (HOSPITAL_COMMUNITY): Payer: Self-pay

## 2022-11-17 IMAGING — DX DG CHEST 2V
2 series · 2 of 2 positions shown · non-contrast
Comparison: 05/18/2005

CLINICAL DATA: Cough, COVID positive

EXAM:
CHEST - 2 VIEW

[chest pa]
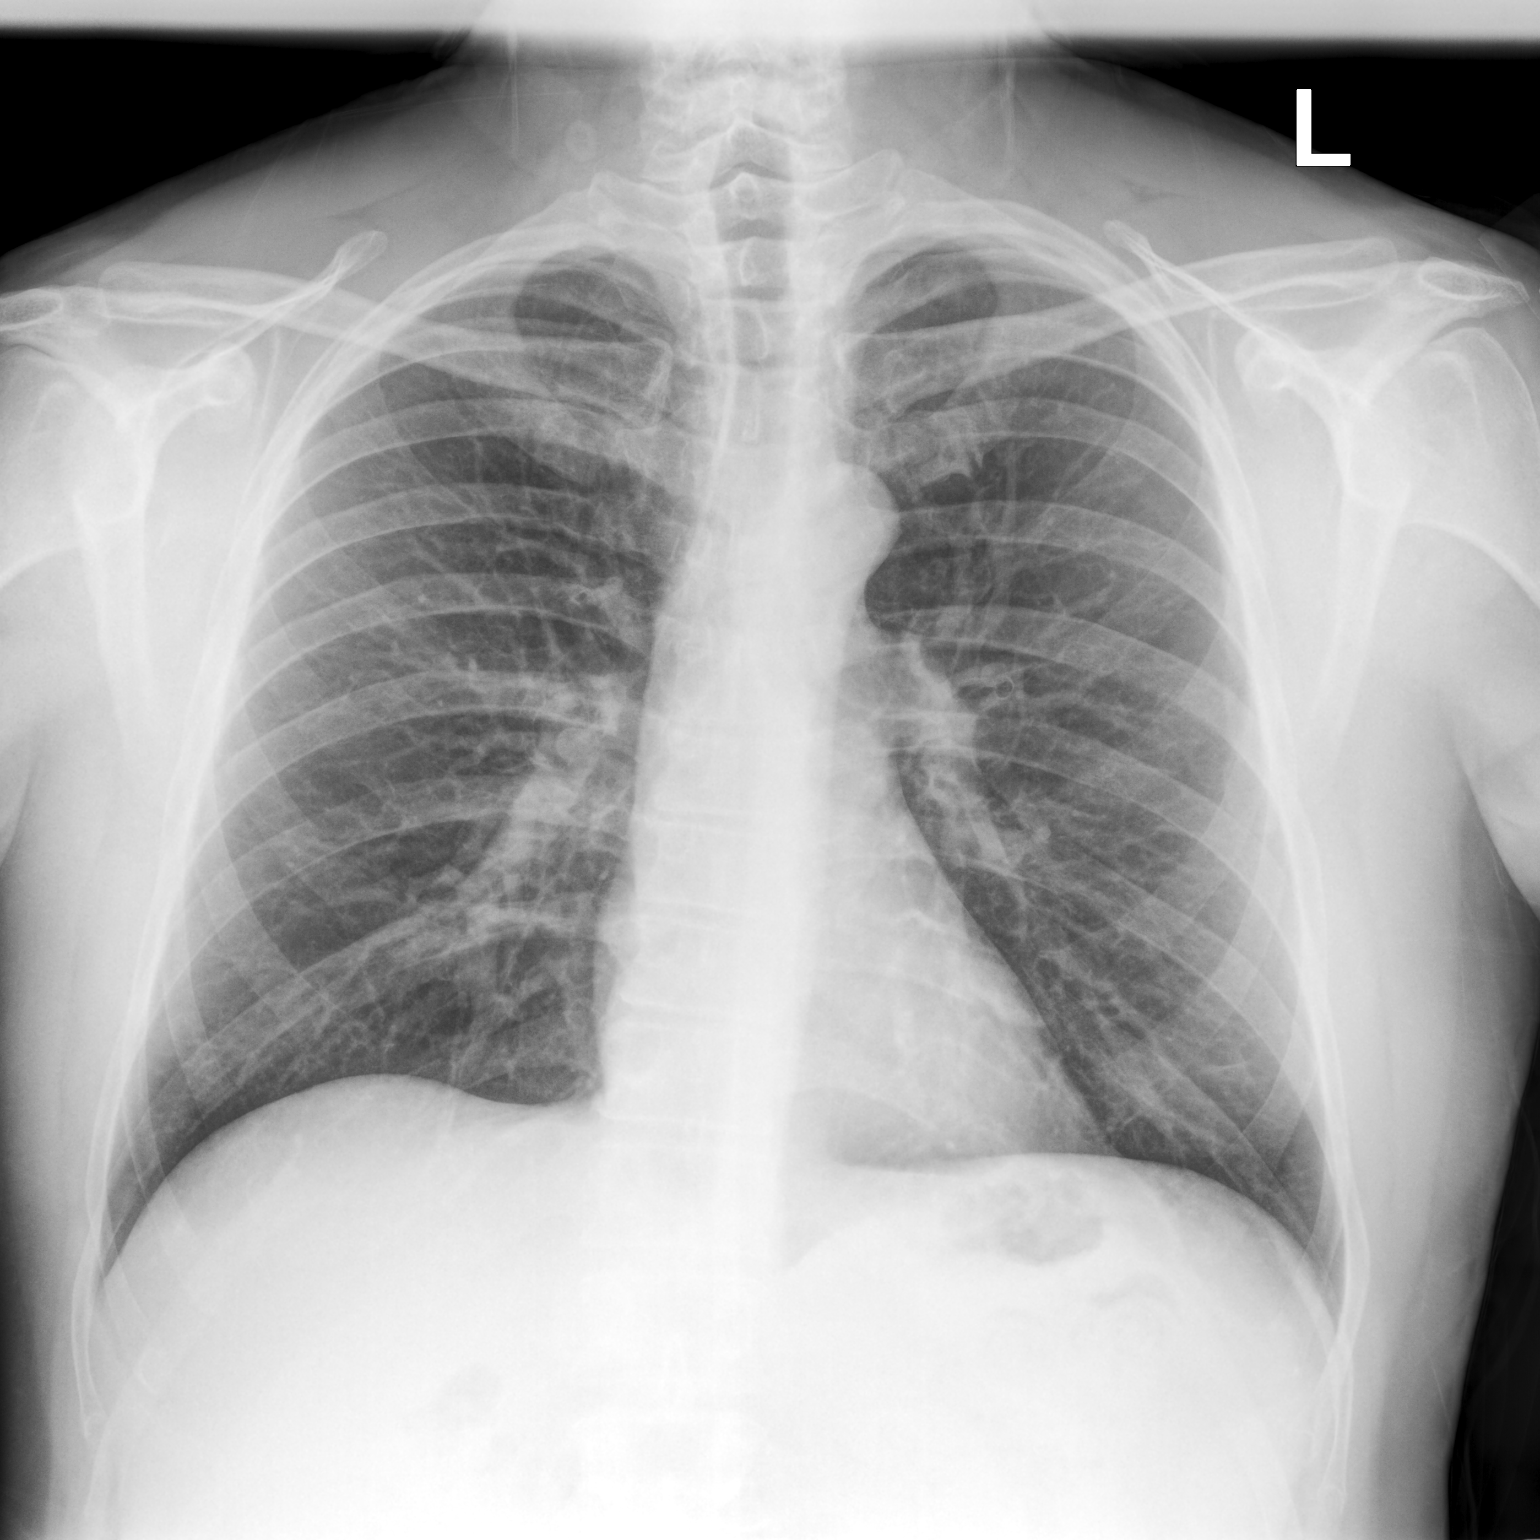

[chest lat]
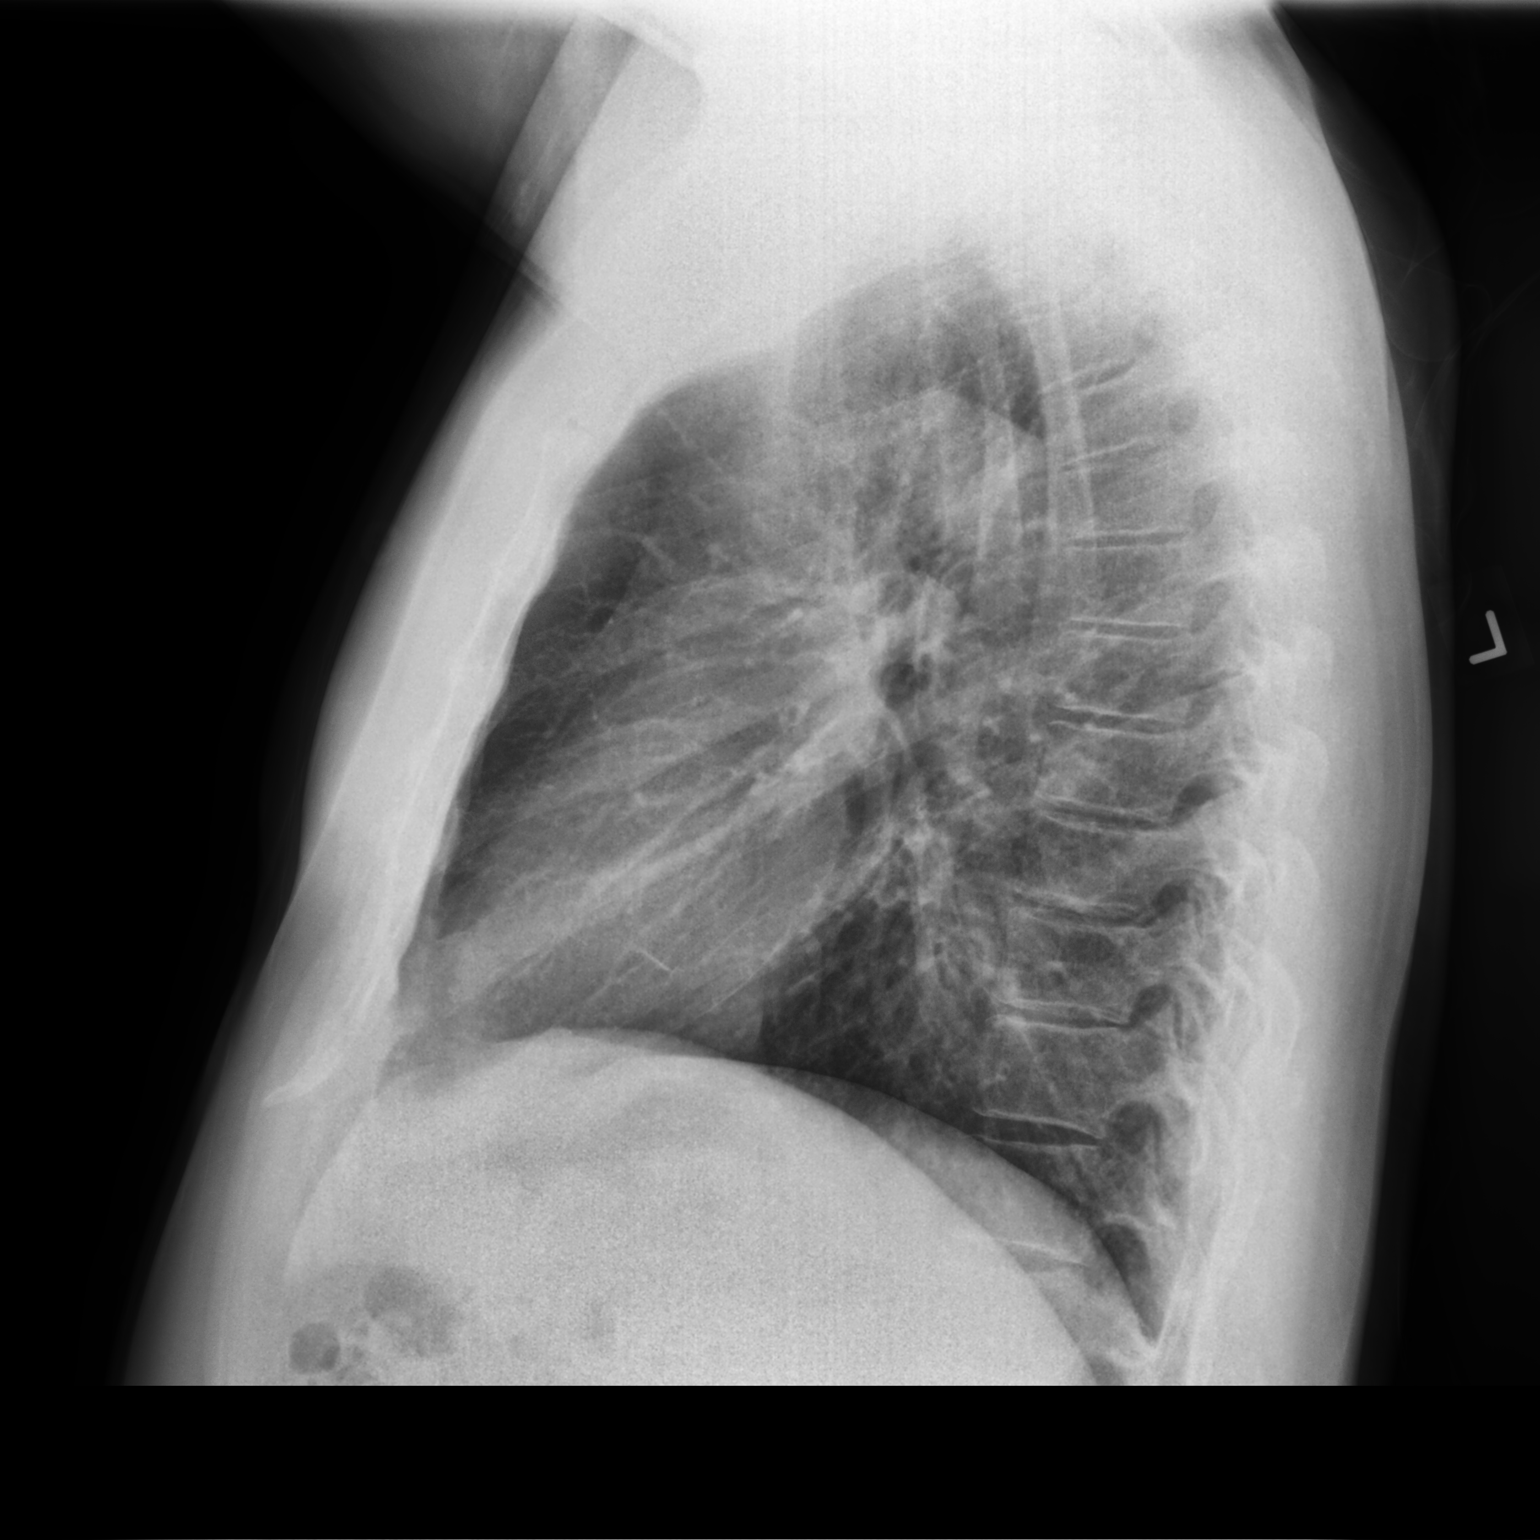

[2 of 2 positions shown; findings below may reference images not displayed]

FINDINGS: The heart size and mediastinal contours are within normal limits.
Both lungs are clear. The visualized skeletal structures are
unremarkable.
IMPRESSION: No active cardiopulmonary disease.

## 2023-05-03 ENCOUNTER — Other Ambulatory Visit (HOSPITAL_COMMUNITY): Payer: Self-pay

## 2023-05-15 ENCOUNTER — Other Ambulatory Visit (HOSPITAL_COMMUNITY): Payer: Self-pay

## 2023-05-15 MED ORDER — METHYLPHENIDATE HCL ER (OSM) 54 MG PO TBCR
54.0000 mg | EXTENDED_RELEASE_TABLET | Freq: Every morning | ORAL | 0 refills | Status: AC
  Filled 2023-05-15: qty 90, 90d supply, fill #0

## 2023-05-20 ENCOUNTER — Other Ambulatory Visit (HOSPITAL_COMMUNITY): Payer: Self-pay

## 2023-09-28 DIAGNOSIS — Z13220 Encounter for screening for lipoid disorders: Secondary | ICD-10-CM | POA: Diagnosis not present

## 2023-09-28 DIAGNOSIS — R7301 Impaired fasting glucose: Secondary | ICD-10-CM | POA: Diagnosis not present

## 2023-09-28 DIAGNOSIS — Z23 Encounter for immunization: Secondary | ICD-10-CM | POA: Diagnosis not present

## 2023-09-28 DIAGNOSIS — E7800 Pure hypercholesterolemia, unspecified: Secondary | ICD-10-CM | POA: Diagnosis not present

## 2023-09-28 DIAGNOSIS — Z114 Encounter for screening for human immunodeficiency virus [HIV]: Secondary | ICD-10-CM | POA: Diagnosis not present

## 2023-09-28 DIAGNOSIS — Z0000 Encounter for general adult medical examination without abnormal findings: Secondary | ICD-10-CM | POA: Diagnosis not present

## 2023-09-28 NOTE — Progress Notes (Signed)
 Subjective Nicholas Osborne is a 58 y.o. male who presents for Annual Exam History of Present Illness The patient is a 58 year old male who presents for evaluation of ADHD, stress, knee pain, right shoulder pain, and foot pain.  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 Despite efforts to maintain a work-life balance, the demands are overwhelming. His diet includes three meals a day, with breakfast typically consisting of coffee and a protein bar. Breakfast was reintroduced after a period of skipping it, which led to increased hunger later in the day. Sleep patterns are irregular, often going to bed around midnight and waking up at 6:30 AM. Compensation for this is attempted by napping on Sunday afternoons. Exercise routine is inconsistent, though movement is incorporated into the day by standing up from the desk as much as possible. Participation in a 30-minute HIIT class with his daughter three to four nights a week since January 2025 is noted. Enjoyment of hiking and walking is mentioned, but weightlifting activities have been reduced. Resistance bands are used for some exercises. Squats and lunges are performed without heavy weights, sometimes using dumbbells, but mostly sticking to calisthenics. Concerta  is taken for ADHD, administered from Monday to Friday during work hours.  A history of foot pain is managed with barefoot shoes that provide relief. Morton's neuroma has been diagnosed.  A recent hyperextension of the knee occurred while stepping on a stick at camp, resulting in a clunking sensation under the patella. The initial injury was painful, but the pain has since subsided. An achy sensation is experienced, and there is nervousness about jogging or running due to perceived instability in the knee.  Additionally, a recent shoulder injury was sustained while throwing a ball too hard, resulting in persistent soreness.  No lab work has been done since  12/2021 due to time constraints. The Pneumovax vaccine was received, and he is due for a Tdap vaccine. Plans to travel to [place] and [place] in 10/2023 are mentioned, with a desire to ensure vaccinations are up-to-date. Uncertainty about hepatitis B vaccination status is expressed, but it is believed that it may have been received three or four years ago. The shingles vaccine was received a few years ago.  Review of Systems Systemic: Feeling fine.  No fever  and no chills. Head: No headache. Cardiovascular: No chest pain or discomfort. Pulmonary: No dyspnea. No Cough. Not coughing up sputum. Gastrointestinal: No abdominal pain. GenitalUrinary: denies dysuria, burning, frequency Psychological: No depression. Skin: No skin lesions. REMAINDER  OF ROS NEGATIVE.   Objective  Pulse 73, height 1.829 m (6'), weight 96.6 kg (213 lb), SpO2 97%. Physical Exam Vitals Normal.  Constitutional Normal.  Neurological Normal.  Psychiatric Normal.  Head and Face Normal.  Eyes Pupils equal and round, conjunctivae clear.  Ears, Nose, Mouth, and Throat Ears: External ear canals and tympanic membranes intact, little hairs present on both eardrums. Mouth/Throat: Mucous membranes moist, no erythema, no exudate.  Neck Supple, no abnormalities.  Respiratory Clear to auscultation, no wheezing, rales or rhonchi.  Cardiovascular Regular rate and rhythm, no murmurs, rubs, or gallops.  Chest Normal.  Gastrointestinal Normal.  Lymphatic Normal.  Musculoskeletal Crepitus in the knee, no effusion or fluid, joint anatomy normal, no malalignment, knee stable.  Extremities Normal.  Skin Normal.  Endocrine Normal.  Allergic/Immunologic Normal.  Results     Assessment & Plan 1. Attention deficit hyperactivity disorder (ADHD). - His current medication regimen includes Concerta , which he takes from Monday to Friday  during his work hours. - The prescription for Concerta  will be  renewed.  2. Stress management. - He reports significant stress related to his work responsibilities, which has been affecting his sleep and overall well-being. - Various non-pharmacological strategies for stress management were discussed, including maintaining a balanced diet, ensuring adequate sleep, regular exercise, and incorporating meditation into his routine.  3. Knee pain. - He reports a clunking sensation in his knee, which is not associated with any instability or significant pain. - The knee examination revealed no effusion, fluid, or malalignment, indicating stability. - He was advised to continue strengthening his quadriceps through exercises such as squats and lunges. - If the knee becomes painful or swollen, he should consult an orthopedic specialist.  4. Right shoulder pain. - He reports soreness in his right shoulder after throwing a ball too hard. - He was advised to monitor the pain and avoid activities that exacerbate it. - If the pain persists or worsens, further evaluation may be necessary.  5. Foot pain. - He has been diagnosed with Morton's neuroma. - He uses barefoot shoes for relief.  6. Health maintenance. - His BMI is currently 28.89, placing him in the overweight category but not within the obese range. - A comprehensive lab workup has been ordered, including lipid panel, CBC, and CMP. - An HIV screening will also be conducted. - He is due for a Tdap vaccine, which will be administered today.

## 2023-09-30 ENCOUNTER — Other Ambulatory Visit (HOSPITAL_COMMUNITY): Payer: Self-pay

## 2023-09-30 MED ORDER — METHYLPHENIDATE HCL ER (OSM) 54 MG PO TBCR
54.0000 mg | EXTENDED_RELEASE_TABLET | Freq: Every morning | ORAL | 0 refills | Status: AC
  Filled 2023-09-30: qty 90, 90d supply, fill #0

## 2023-09-30 NOTE — Progress Notes (Signed)
 This is a blank note Epic is requiring me to sign.

## 2024-03-18 ENCOUNTER — Other Ambulatory Visit (HOSPITAL_COMMUNITY): Payer: Self-pay

## 2024-03-23 ENCOUNTER — Other Ambulatory Visit (HOSPITAL_COMMUNITY): Payer: Self-pay

## 2024-03-25 ENCOUNTER — Other Ambulatory Visit (HOSPITAL_COMMUNITY): Payer: Self-pay

## 2024-03-25 ENCOUNTER — Encounter: Payer: Self-pay | Admitting: Sports Medicine

## 2024-03-25 ENCOUNTER — Ambulatory Visit: Admitting: Sports Medicine

## 2024-03-25 ENCOUNTER — Other Ambulatory Visit (INDEPENDENT_AMBULATORY_CARE_PROVIDER_SITE_OTHER): Payer: Self-pay

## 2024-03-25 DIAGNOSIS — G8929 Other chronic pain: Secondary | ICD-10-CM

## 2024-03-25 DIAGNOSIS — M25562 Pain in left knee: Secondary | ICD-10-CM

## 2024-03-25 DIAGNOSIS — M222X2 Patellofemoral disorders, left knee: Secondary | ICD-10-CM | POA: Diagnosis not present

## 2024-03-25 MED ORDER — METHYLPREDNISOLONE ACETATE 40 MG/ML IJ SUSP
60.0000 mg | INTRAMUSCULAR | Status: AC | PRN
  Administered 2024-03-25: 60 mg via INTRA_ARTICULAR

## 2024-03-25 MED ORDER — LIDOCAINE HCL 1 % IJ SOLN
2.0000 mL | INTRAMUSCULAR | Status: AC | PRN
  Administered 2024-03-25: 2 mL

## 2024-03-25 MED ORDER — BUPIVACAINE HCL 0.25 % IJ SOLN
2.0000 mL | INTRAMUSCULAR | Status: AC | PRN
  Administered 2024-03-25: 2 mL via INTRA_ARTICULAR

## 2024-03-25 NOTE — Progress Notes (Addendum)
 Nicholas Osborne - 58 y.o. male MRN [id number]  Date of birth: Oct 24, 1965  Office Visit Note: Visit Date: 03/25/2024 PCP: Ardean Ronnald BRAVO., MD Referred by: Ardean Ronnald BRAVO., MD  Subjective: Chief Complaint  Patient presents with   Left Knee - Pain   HPI: Nicholas Osborne is a pleasant 58 y.o. male who presents today for left knee pain after hyperextension injury in Feb 2025.  He is a healthy and active individual - tries to incorporate movement and exercise in-between his work schedule. Nicholas Osborne had an incident back in February where he extended to step and slipped on a stick causing a hyperextension mechanism for the knee.  He did not feel or hear any audible pop or click at that time. But following thi,s he did have some swelling and associated pain in the knee. He treated this conservatively with ice and over-the-counter home treatments.  His initial pain has improved but since this time but he has continued to experience a clunk when walking or performing physical activity, this is not painful.  The pain is fairly localized to the medial aspect of the patella.  He has not been back to running or vigorous physical activity given the concern for instability or the fact he may be more damage.  He denies any previous injury to this knee.  He is not taking any consistent medications for this.   Pertinent ROS were reviewed with the patient and found to be negative unless otherwise specified above in HPI.   Assessment & Plan: Visit Diagnoses:  1. Patellofemoral disorder of left knee   2. Chronic pain of left knee    Plan: Impression is left knee pain and patellar 'clicking' since hyperextension-like injury back in February.  He does have reproducible clicking on examination but this is coming from within the patellofemoral joint itself and not the knee joint. There is also no ligamental or meniscal instability/provocative maneuvers.  He does have an inflamed medial plica which is where his  pain localizes as well as a degree of patellofemoral disorder, this is what is likely causing his clicking given his symptomatology and the nature of his injury.  Possible he has underlying chondromalacia contributing, but would need to be further evaluated with MRI. X-ray shows no bony loose bodies present. At this point, given the chronicity of his discomfort, we did proceed with corticosteroid injection, patient tolerated well.  Advised on postinjection protocol.  I would like him to get started on home rehab exercises for stabilization of the knee and patellofemoral joint, handout and exercises reviewed today.  He may begin these once daily starting this upcoming week.  Over the next few weeks then, I would like him to resume physical activity on a progressive fashion as there is a very low likelihood of him causing any further injury/damage.  He will of course let pain be his guide with this however.  If his pain does worsen with activity or he has recurrence of the swelling/effusion or symptoms of instability, he will follow back up with me. Otherwise PRN.  Follow-up: Return if symptoms worsen or fail to improve.   Meds & Orders: No orders of the defined types were placed in this encounter.   Orders Placed This Encounter  Procedures   Large Joint Inj: R knee   XR Knee Complete 4 Views Left     Procedures: Large Joint Inj: L knee on 03/25/2024 11:58 AM Details: 22 G 1.5 in needle, anterolateral approach Medications: 2 mL  lidocaine  1 %; 2 mL bupivacaine 0.25 %; 60 mg methylPREDNISolone acetate 40 MG/ML Outcome: tolerated well, no immediate complications  Knee Injection, Left: After discussion on risks/benefits/indications, informed verbal consent was obtained and a timeout was performed, patient was seated on exam table. The patient's knee was prepped with Betadine and alcohol swab and utilizing anterolateral approach, the patient's knee was injected intraarticularly with 2:2:1.5 lidocaine   1%:bupivicaine 0.25%:depomedrol. Patient tolerated the procedure well without immediate complications.  Procedure, treatment alternatives, risks and benefits explained, specific risks discussed. Consent was given by the patient. Patient was prepped and draped in the usual sterile fashion.          Clinical History: No specialty comments available.  He reports that he has never smoked. He has never used smokeless tobacco. No results for input(s): HGBA1C, LABURIC in the last 8760 hours.  Objective:    Physical Exam  Gen: Well-appearing, in no acute distress; non-toxic CV: Well-perfused. Warm.  Resp: Breathing unlabored on room air; no wheezing. Psych: Fluid speech in conversation; appropriate affect; normal thought process  Ortho Exam - Left knee: There is no redness swelling or effusion present.  There is preserved range of motion of the knee from 0-135 degrees.  No varus or valgus instability.  Negative anterior/posterior drawer, negative Lachman.  Negative McMurray's and Thessaly's testing.  There is some tenderness off of the medial aspect of the patella with an inflamed medial plica present. There is mild discomfort with patellar compression test and reproducible patellar clicking with passive flexion/extension without pain.   Imaging: XR Knee Complete 4 Views Left Result Date: 03/25/2024 4 view x-ray of the left knee including AP standing, Rosenberg, lateral and sunrise view were ordered and reviewed by myself today.  X-rays demonstrate preserved weightbearing tibiofemoral joint spaces.  There is mild narrowing of the patellofemoral joint with a very tiny superior spur.  No bony loose bodies identified.  No joint effusion noted.  No acute bony abnormality otherwise noted.   Past Medical/Family/Surgical/Social History: Medications & Allergies reviewed per EMR, new medications updated. Patient Active Problem List   Diagnosis Date Noted   COVID-19 virus infection 07/07/2021    Tachycardia 07/07/2021   Urinary urgency 07/07/2021   Past Medical History:  Diagnosis Date   ADHD    Family History  Problem Relation Age of Onset   Hypertension Father    Diabetes Maternal Grandmother    Cancer Paternal Grandfather 15       intestinal   Colon cancer Neg Hx    Colon polyps Neg Hx    Esophageal cancer Neg Hx    Stomach cancer Neg Hx    Rectal cancer Neg Hx    Past Surgical History:  Procedure Laterality Date   NASAL RECONSTRUCTION WITH SEPTAL REPAIR N/A 07/15/2015   Procedure: SEPTAL REPAIR RECONSTRUCTION ;  Surgeon: Lynwood JINNY Genet, MD;  Location: [facility];  Service: ENT;  Laterality: N/A;   ORCHIOPEXY Bilateral 1969   TURBINATE REDUCTION Bilateral 07/15/2015   Procedure: BILATERAL TURBINATE REDUCTION;  Surgeon: Lynwood JINNY Genet, MD;  Location: [facility];  Service: ENT;  Laterality: Bilateral;   WISDOM TOOTH EXTRACTION  1986, 2015   Social History   Occupational History   Not on file  Tobacco Use   Smoking status: Never   Smokeless tobacco: Never  Vaping Use   Vaping status: Never Used  Substance and Sexual Activity   Alcohol use: Yes    Alcohol/week: 2.0 standard drinks of alcohol  Types: 2 Standard drinks or equivalent per week    Comment: 1-2 drinks (beer, wine)   Drug use: No   Sexual activity: Not on file

## 2024-03-25 NOTE — Progress Notes (Signed)
 Patient says that he had a hyperextension injury to his left knee when he slipped in February. He says that initially he did have swelling, but this swelling did improve with home treatments including ice. He denies feeling a pop or a pull at the time of injury, although he does feel and hear a clunk when walking now. This clunk is not painful. He has pain just medial to the patella. He has had return of swelling after doing more activity on uneven surfaces. Patient has been unable to run or otherwise be active in the ways that he would like, due to his left knee pain. He is not currently treating his pain at home. Patient denies history of significant injury to the left knee.

## 2024-04-01 ENCOUNTER — Other Ambulatory Visit (HOSPITAL_COMMUNITY): Payer: Self-pay

## 2024-04-01 MED ORDER — METHYLPHENIDATE HCL ER (OSM) 54 MG PO TBCR
54.0000 mg | EXTENDED_RELEASE_TABLET | Freq: Every morning | ORAL | 0 refills | Status: AC
  Filled 2024-04-01: qty 90, 90d supply, fill #0

## 2024-04-06 ENCOUNTER — Encounter: Payer: Self-pay | Admitting: Radiology

## 2024-04-10 ENCOUNTER — Other Ambulatory Visit (HOSPITAL_COMMUNITY): Payer: Self-pay

## 2024-05-04 ENCOUNTER — Encounter: Payer: Self-pay | Admitting: Dermatology

## 2024-05-04 ENCOUNTER — Ambulatory Visit: Admitting: Dermatology

## 2024-05-04 DIAGNOSIS — L578 Other skin changes due to chronic exposure to nonionizing radiation: Secondary | ICD-10-CM | POA: Diagnosis not present

## 2024-05-04 DIAGNOSIS — D2230 Melanocytic nevi of unspecified part of face: Secondary | ICD-10-CM

## 2024-05-04 DIAGNOSIS — L821 Other seborrheic keratosis: Secondary | ICD-10-CM | POA: Diagnosis not present

## 2024-05-04 DIAGNOSIS — D2239 Melanocytic nevi of other parts of face: Secondary | ICD-10-CM

## 2024-05-04 DIAGNOSIS — W908XXA Exposure to other nonionizing radiation, initial encounter: Secondary | ICD-10-CM | POA: Diagnosis not present

## 2024-05-04 NOTE — Patient Instructions (Signed)

## 2024-05-04 NOTE — Progress Notes (Signed)
   New Patient Visit   History of Present Illness Nicholas Osborne is a 58 year old male who presents with a facial lesion for evaluation.  He has had a spot on his face for a couple of years, which bled slightly and became crusty over the summer after possibly being scratched. It has since healed, but due to his history of significant sun exposure, he wanted it evaluated. He is not overly concerned about the lesion.  He wears reading glasses, which may have contributed to the irritation of the spot. He has never undergone a skin biopsy before.  He acknowledges significant sun exposure in the past and currently uses sunscreen with SPF 30 or more. He did not use sunscreen consistently earlier in life.  Pt has a growth on his face that has changed. Pt has no hx of skin cancer  The following portions of the chart were reviewed this encounter and updated as appropriate: medications, allergies, medical history  Review of Systems:  No other skin or systemic complaints except as noted in HPI or Assessment and Plan.  Objective  Well appearing patient in no apparent distress; mood and affect are within normal limits.  A focused examination was performed of the following areas: Face 3mm skin colored papule  Relevant exam findings are noted in the Assessment and Plan.    Assessment & Plan  Assessment & Plan Benign facial skin lesions (seborrheic keratosis and melanocytic nevus) Seborrheic keratosis benign. Melanocytic nevus with recent changes, currently benign. Monitoring advised due to changes and sun exposure history. Biopsy discussed as an option if changes occur. - Monitor melanocytic nevus with follow-up in 4-6 months, photo taken today - Advised use of sunscreen SPF 30 or more. - Schedule whole body skin examination before year-end if possible.  ACTINIC DAMAGE - chronic, secondary to cumulative UV radiation exposure/sun exposure over time - diffuse scaly erythematous macules with  underlying dyspigmentation - Recommend daily broad spectrum sunscreen SPF 30+ to sun-exposed areas, reapply every 2 hours as needed.  - Recommend staying in the shade or wearing long sleeves, sun glasses (UVA+UVB protection) and wide brim hats (4-inch brim around the entire circumference of the hat). - Call for new or changing lesions.  SEBORRHEIC KERATOSIS - Stuck-on, waxy, tan-brown papules and/or plaques  - Benign-appearing - Discussed benign etiology and prognosis. - Observe - Call for any changes      Return in 4 months (on 09/02/2024) for TBSE with brenda or dr alm, 4-6 month spot check on face.  I, Darice Smock, CMA, am acting as scribe for RUFUS CHRISTELLA HOLY, MD.   Documentation: I have reviewed the above documentation for accuracy and completeness, and I agree with the above.  RUFUS CHRISTELLA HOLY, MD

## 2024-06-02 ENCOUNTER — Encounter: Payer: Self-pay | Admitting: Physician Assistant

## 2024-06-02 ENCOUNTER — Ambulatory Visit (INDEPENDENT_AMBULATORY_CARE_PROVIDER_SITE_OTHER): Admitting: Physician Assistant

## 2024-06-02 VITALS — BP 134/86 | HR 80

## 2024-06-02 DIAGNOSIS — Z1283 Encounter for screening for malignant neoplasm of skin: Secondary | ICD-10-CM

## 2024-06-02 DIAGNOSIS — D2271 Melanocytic nevi of right lower limb, including hip: Secondary | ICD-10-CM | POA: Diagnosis not present

## 2024-06-02 DIAGNOSIS — D489 Neoplasm of uncertain behavior, unspecified: Secondary | ICD-10-CM

## 2024-06-02 DIAGNOSIS — L570 Actinic keratosis: Secondary | ICD-10-CM | POA: Diagnosis not present

## 2024-06-02 DIAGNOSIS — D229 Melanocytic nevi, unspecified: Secondary | ICD-10-CM

## 2024-06-02 DIAGNOSIS — L821 Other seborrheic keratosis: Secondary | ICD-10-CM

## 2024-06-02 DIAGNOSIS — L814 Other melanin hyperpigmentation: Secondary | ICD-10-CM

## 2024-06-02 DIAGNOSIS — D1801 Hemangioma of skin and subcutaneous tissue: Secondary | ICD-10-CM

## 2024-06-02 DIAGNOSIS — W908XXA Exposure to other nonionizing radiation, initial encounter: Secondary | ICD-10-CM

## 2024-06-02 DIAGNOSIS — L578 Other skin changes due to chronic exposure to nonionizing radiation: Secondary | ICD-10-CM

## 2024-06-02 NOTE — Progress Notes (Signed)
 "  Total Body Skin Exam (TBSE) Visit   Subjective  Nicholas Osborne is a 58 y.o. male NEW PATIENT who presents for the following: Skin Cancer Screening and Full Body Skin Exam  Concerns: skin lesion on external auditory meatus and mole on toe that he has had for many many years seems to be getting bigger.   The following portions of the chart were reviewed this encounter and updated as appropriate: medications, allergies, medical history  Review of Systems:  No other skin or systemic complaints except as noted in HPI or Assessment and Plan.  Objective  Well appearing patient in no apparent distress; mood and affect are within normal limits.  A full examination was performed including scalp, head, eyes, ears, nose, lips, neck, chest, axillae, abdomen, back, buttocks, bilateral upper extremities, bilateral lower extremities, hands, feet, fingers, toes, fingernails, and toenails. All findings within normal limits unless otherwise noted below.   Relevant physical exam findings are noted in the Assessment and Plan.  Right Ear 0.4 Flesh colored papule   Right 3rd Plantar Toe 0.7 cm Irregular brown macule   Assessment & Plan   LENTIGINES, SEBORRHEIC KERATOSES, HEMANGIOMAS - Benign normal skin lesions - Benign-appearing - Call for any changes  MELANOCYTIC NEVI - Tan-brown and/or pink-flesh-colored symmetric macules and papules - Benign appearing on exam today - Observation - Call clinic for new or changing moles - Recommend daily use of broad spectrum spf 30+ sunscreen to sun-exposed areas.   ACTINIC DAMAGE - Chronic condition, secondary to cumulative UV/sun exposure - diffuse scaly erythematous macules with underlying dyspigmentation - Recommend daily broad spectrum sunscreen SPF 30+ to sun-exposed areas, reapply every 2 hours as needed.  - Staying in the shade or wearing long sleeves, sun glasses (UVA+UVB protection) and wide brim hats (4-inch brim around the entire circumference  of the hat) are also recommended for sun protection.  - Call for new or changing lesions.  SKIN CANCER SCREENING PERFORMED TODAY.    NEOPLASM OF UNCERTAIN BEHAVIOR (2) Right Ear - Skin / nail biopsy Type of biopsy: tangential   Informed consent: discussed and consent obtained   Timeout: patient name, date of birth, surgical site, and procedure verified   Procedure prep:  Patient was prepped and draped in usual sterile fashion Prep type:  Isopropyl alcohol Anesthesia: the lesion was anesthetized in a standard fashion   Anesthetic:  1% lidocaine  w/ epinephrine  1-100,000 buffered w/ 8.4% NaHCO3 Instrument used: flexible razor blade   Hemostasis achieved with: pressure, aluminum chloride and electrodesiccation   Outcome: patient tolerated procedure well   Post-procedure details: sterile dressing applied and wound care instructions given   Dressing type: bandage and petrolatum    Specimen A - Surgical pathology Differential Diagnosis: BCC VS mole (biopsy preformed with curette and it will be in pieces)  Check Margins: No Right 3rd Plantar Toe - Epidermal / dermal shaving  Lesion diameter (cm):  0.7 Informed consent: discussed and consent obtained   Timeout: patient name, date of birth, surgical site, and procedure verified   Procedure prep:  Patient was prepped and draped in usual sterile fashion Prep type:  Isopropyl alcohol Anesthesia: the lesion was anesthetized in a standard fashion   Anesthetic:  1% lidocaine  w/ epinephrine  1-100,000 buffered w/ 8.4% NaHCO3 Instrument used: flexible razor blade   Hemostasis achieved with: pressure, aluminum chloride and electrodesiccation   Outcome: patient tolerated procedure well   Post-procedure details: sterile dressing applied and wound care instructions given   Dressing type: bandage  and petrolatum    Specimen B - Surgical pathology Differential Diagnosis: DN vs MM  Check Margins: No ACTINIC SKIN DAMAGE   CHERRY  ANGIOMA   MULTIPLE BENIGN NEVI   SEBORRHEIC KERATOSIS   LENTIGINES   SCREENING EXAM FOR SKIN CANCER   Return in about 1 year (around 06/02/2025) for TBSE.  I, Doyce Pan, CMA, am acting as scribe for Inessa Wardrop K, PA-C.   Documentation: I have reviewed the above documentation for accuracy and completeness, and I agree with the above.  Emmilee Reamer K, PA-C   "

## 2024-06-02 NOTE — Patient Instructions (Addendum)

## 2024-06-05 LAB — SURGICAL PATHOLOGY

## 2024-06-07 ENCOUNTER — Ambulatory Visit: Payer: Self-pay | Admitting: Physician Assistant

## 2024-11-03 ENCOUNTER — Ambulatory Visit: Admitting: Dermatology

## 2025-06-08 ENCOUNTER — Ambulatory Visit: Admitting: Physician Assistant
